# Patient Record
Sex: Female | Born: 1950 | Race: White | Hispanic: No | Smoking: Never smoker
Health system: Southern US, Community
[De-identification: ages and names within clinical notes are randomized; demographics above are authoritative.]

## PROBLEM LIST (undated history)

## (undated) DIAGNOSIS — E785 Hyperlipidemia, unspecified: Secondary | ICD-10-CM

## (undated) DIAGNOSIS — E079 Disorder of thyroid, unspecified: Secondary | ICD-10-CM

## (undated) DIAGNOSIS — I10 Essential (primary) hypertension: Secondary | ICD-10-CM

## (undated) DIAGNOSIS — N1831 Chronic kidney disease, stage 3a: Secondary | ICD-10-CM

## (undated) DIAGNOSIS — E039 Hypothyroidism, unspecified: Secondary | ICD-10-CM

## (undated) HISTORY — PX: CHOLECYSTECTOMY: SHX55

---

## 2021-12-10 ENCOUNTER — Emergency Department (HOSPITAL_BASED_OUTPATIENT_CLINIC_OR_DEPARTMENT_OTHER): Payer: Medicare Other

## 2021-12-10 ENCOUNTER — Encounter (HOSPITAL_BASED_OUTPATIENT_CLINIC_OR_DEPARTMENT_OTHER): Payer: Self-pay

## 2021-12-10 ENCOUNTER — Other Ambulatory Visit: Payer: Self-pay

## 2021-12-10 ENCOUNTER — Inpatient Hospital Stay (HOSPITAL_BASED_OUTPATIENT_CLINIC_OR_DEPARTMENT_OTHER)
Admission: EM | Admit: 2021-12-10 | Discharge: 2021-12-14 | DRG: 641 | Disposition: A | Discharge status: Home Health | Payer: Medicare Other | Attending: Internal Medicine | Admitting: Internal Medicine

## 2021-12-10 DIAGNOSIS — Z9049 Acquired absence of other specified parts of digestive tract: Secondary | ICD-10-CM

## 2021-12-10 DIAGNOSIS — Z79899 Other long term (current) drug therapy: Secondary | ICD-10-CM

## 2021-12-10 DIAGNOSIS — Z6834 Body mass index (BMI) 34.0-34.9, adult: Secondary | ICD-10-CM

## 2021-12-10 DIAGNOSIS — R21 Rash and other nonspecific skin eruption: Secondary | ICD-10-CM | POA: Diagnosis present

## 2021-12-10 DIAGNOSIS — K5900 Constipation, unspecified: Secondary | ICD-10-CM | POA: Diagnosis present

## 2021-12-10 DIAGNOSIS — M47816 Spondylosis without myelopathy or radiculopathy, lumbar region: Secondary | ICD-10-CM | POA: Diagnosis present

## 2021-12-10 DIAGNOSIS — E785 Hyperlipidemia, unspecified: Secondary | ICD-10-CM | POA: Diagnosis present

## 2021-12-10 DIAGNOSIS — I129 Hypertensive chronic kidney disease with stage 1 through stage 4 chronic kidney disease, or unspecified chronic kidney disease: Secondary | ICD-10-CM | POA: Diagnosis present

## 2021-12-10 DIAGNOSIS — E039 Hypothyroidism, unspecified: Secondary | ICD-10-CM | POA: Diagnosis present

## 2021-12-10 DIAGNOSIS — E669 Obesity, unspecified: Secondary | ICD-10-CM | POA: Diagnosis present

## 2021-12-10 DIAGNOSIS — N179 Acute kidney failure, unspecified: Secondary | ICD-10-CM | POA: Diagnosis present

## 2021-12-10 DIAGNOSIS — Z888 Allergy status to other drugs, medicaments and biological substances status: Secondary | ICD-10-CM

## 2021-12-10 DIAGNOSIS — I3139 Other pericardial effusion (noninflammatory): Secondary | ICD-10-CM | POA: Diagnosis present

## 2021-12-10 DIAGNOSIS — Z79890 Hormone replacement therapy (postmenopausal): Secondary | ICD-10-CM

## 2021-12-10 DIAGNOSIS — I10 Essential (primary) hypertension: Secondary | ICD-10-CM | POA: Diagnosis present

## 2021-12-10 DIAGNOSIS — N1831 Chronic kidney disease, stage 3a: Secondary | ICD-10-CM | POA: Diagnosis present

## 2021-12-10 DIAGNOSIS — K5790 Diverticulosis of intestine, part unspecified, without perforation or abscess without bleeding: Secondary | ICD-10-CM | POA: Diagnosis present

## 2021-12-10 DIAGNOSIS — E860 Dehydration: Secondary | ICD-10-CM | POA: Diagnosis present

## 2021-12-10 DIAGNOSIS — G8929 Other chronic pain: Secondary | ICD-10-CM | POA: Diagnosis present

## 2021-12-10 DIAGNOSIS — K760 Fatty (change of) liver, not elsewhere classified: Secondary | ICD-10-CM | POA: Diagnosis present

## 2021-12-10 HISTORY — DX: Hypothyroidism, unspecified: E03.9

## 2021-12-10 HISTORY — DX: Hyperlipidemia, unspecified: E78.5

## 2021-12-10 HISTORY — DX: Disorder of thyroid, unspecified: E07.9

## 2021-12-10 HISTORY — DX: Chronic kidney disease, stage 3a: N18.31

## 2021-12-10 HISTORY — DX: Essential (primary) hypertension: I10

## 2021-12-10 LAB — COMPREHENSIVE METABOLIC PANEL
ALT: 22 U/L (ref 0–44)
AST: 24 U/L (ref 15–41)
Albumin: 4.6 g/dL (ref 3.5–5.0)
Alkaline Phosphatase: 97 U/L (ref 38–126)
Anion gap: 10 (ref 5–15)
BUN: 45 mg/dL — ABNORMAL HIGH (ref 8–23)
CO2: 26 mmol/L (ref 22–32)
Calcium: 13.1 mg/dL (ref 8.9–10.3)
Chloride: 102 mmol/L (ref 98–111)
Creatinine, Ser: 3.98 mg/dL — ABNORMAL HIGH (ref 0.44–1.00)
GFR, Estimated: 12 mL/min — ABNORMAL LOW (ref >60)
Glucose, Bld: 111 mg/dL — ABNORMAL HIGH (ref 70–99)
Potassium: 3.6 mmol/L (ref 3.5–5.1)
Sodium: 138 mmol/L (ref 135–145)
Total Bilirubin: 0.9 mg/dL (ref 0.3–1.2)
Total Protein: 8.2 g/dL — ABNORMAL HIGH (ref 6.5–8.1)

## 2021-12-10 LAB — CBC WITH DIFFERENTIAL/PLATELET
Abs Immature Granulocytes: 0.05 10*3/uL (ref 0.00–0.07)
Basophils Absolute: 0 10*3/uL (ref 0.0–0.1)
Basophils Relative: 0 %
Eosinophils Absolute: 0 10*3/uL (ref 0.0–0.5)
Eosinophils Relative: 0 %
HCT: 39.5 % (ref 36.0–46.0)
Hemoglobin: 13.8 g/dL (ref 12.0–15.0)
Immature Granulocytes: 0 %
Lymphocytes Relative: 17 %
Lymphs Abs: 2.1 10*3/uL (ref 0.7–4.0)
MCH: 30.1 pg (ref 26.0–34.0)
MCHC: 34.9 g/dL (ref 30.0–36.0)
MCV: 86.1 fL (ref 80.0–100.0)
Monocytes Absolute: 1 10*3/uL (ref 0.1–1.0)
Monocytes Relative: 8 %
Neutro Abs: 9.1 10*3/uL — ABNORMAL HIGH (ref 1.7–7.7)
Neutrophils Relative %: 75 %
Platelets: 211 10*3/uL (ref 150–400)
RBC: 4.59 MIL/uL (ref 3.87–5.11)
RDW: 13.2 % (ref 11.5–15.5)
WBC: 12.3 10*3/uL — ABNORMAL HIGH (ref 4.0–10.5)
nRBC: 0 % (ref 0.0–0.2)

## 2021-12-10 LAB — LIPASE, BLOOD: Lipase: 33 U/L (ref 11–51)

## 2021-12-10 LAB — URINALYSIS, MICROSCOPIC (REFLEX): WBC, UA: NONE SEEN WBC/hpf (ref 0–5)

## 2021-12-10 LAB — URINALYSIS, ROUTINE W REFLEX MICROSCOPIC
Bilirubin Urine: NEGATIVE
Glucose, UA: NEGATIVE mg/dL
Ketones, ur: NEGATIVE mg/dL
Leukocytes,Ua: NEGATIVE
Nitrite: NEGATIVE
Protein, ur: 30 mg/dL — AB
Specific Gravity, Urine: 1.015 (ref 1.005–1.030)
pH: 8.5 — ABNORMAL HIGH (ref 5.0–8.0)

## 2021-12-10 LAB — MAGNESIUM: Magnesium: 3.4 mg/dL — ABNORMAL HIGH (ref 1.7–2.4)

## 2021-12-10 LAB — LACTIC ACID, PLASMA
Lactic Acid, Venous: 1.3 mmol/L (ref 0.5–1.9)
Lactic Acid, Venous: 1.5 mmol/L (ref 0.5–1.9)

## 2021-12-10 LAB — TROPONIN I (HIGH SENSITIVITY): Troponin I (High Sensitivity): 17 ng/L (ref <18)

## 2021-12-10 LAB — PHOSPHORUS: Phosphorus: 3.2 mg/dL (ref 2.5–4.6)

## 2021-12-10 MED ORDER — CALCITONIN (SALMON) 200 UNIT/ML IJ SOLN
4.0000 [IU]/kg | Freq: Two times a day (BID) | INTRAMUSCULAR | Status: AC
  Administered 2021-12-11 – 2021-12-12 (×4): 308 [IU] via INTRAMUSCULAR
  Filled 2021-12-10 (×6): qty 1.54

## 2021-12-10 MED ORDER — SODIUM CHLORIDE 0.9 % IV BOLUS
1000.0000 mL | Freq: Once | INTRAVENOUS | Status: AC
  Administered 2021-12-10: 1000 mL via INTRAVENOUS

## 2021-12-10 NOTE — ED Notes (Signed)
Patient transported to CT 

## 2021-12-10 NOTE — Progress Notes (Signed)
  TRH will assume care on arrival to accepting facility. Until arrival, care as per EDP. However, TRH available 24/7 for questions and assistance.   Nursing staff please page TRH Admits and Consults ([phone]) as soon as the patient arrives to the hospital.  Kemarion Abbey, DO [facility]  

## 2021-12-10 NOTE — ED Triage Notes (Signed)
Pt states she is having difficulty walking today r/t left sided back pain Excessive burping Hypertensive

## 2021-12-11 ENCOUNTER — Inpatient Hospital Stay (HOSPITAL_COMMUNITY): Payer: Medicare Other

## 2021-12-11 ENCOUNTER — Encounter (HOSPITAL_COMMUNITY): Payer: Self-pay | Admitting: Internal Medicine

## 2021-12-11 DIAGNOSIS — N179 Acute kidney failure, unspecified: Secondary | ICD-10-CM | POA: Diagnosis present

## 2021-12-11 DIAGNOSIS — E039 Hypothyroidism, unspecified: Secondary | ICD-10-CM

## 2021-12-11 DIAGNOSIS — M47816 Spondylosis without myelopathy or radiculopathy, lumbar region: Secondary | ICD-10-CM | POA: Diagnosis present

## 2021-12-11 DIAGNOSIS — Z6834 Body mass index (BMI) 34.0-34.9, adult: Secondary | ICD-10-CM | POA: Diagnosis not present

## 2021-12-11 DIAGNOSIS — I3139 Other pericardial effusion (noninflammatory): Secondary | ICD-10-CM | POA: Diagnosis present

## 2021-12-11 DIAGNOSIS — I10 Essential (primary) hypertension: Secondary | ICD-10-CM | POA: Diagnosis not present

## 2021-12-11 DIAGNOSIS — K760 Fatty (change of) liver, not elsewhere classified: Secondary | ICD-10-CM | POA: Diagnosis present

## 2021-12-11 DIAGNOSIS — K5790 Diverticulosis of intestine, part unspecified, without perforation or abscess without bleeding: Secondary | ICD-10-CM | POA: Diagnosis present

## 2021-12-11 DIAGNOSIS — Z9049 Acquired absence of other specified parts of digestive tract: Secondary | ICD-10-CM | POA: Diagnosis not present

## 2021-12-11 DIAGNOSIS — Z888 Allergy status to other drugs, medicaments and biological substances status: Secondary | ICD-10-CM | POA: Diagnosis not present

## 2021-12-11 DIAGNOSIS — E785 Hyperlipidemia, unspecified: Secondary | ICD-10-CM | POA: Diagnosis present

## 2021-12-11 DIAGNOSIS — N1831 Chronic kidney disease, stage 3a: Secondary | ICD-10-CM | POA: Diagnosis present

## 2021-12-11 DIAGNOSIS — I129 Hypertensive chronic kidney disease with stage 1 through stage 4 chronic kidney disease, or unspecified chronic kidney disease: Secondary | ICD-10-CM | POA: Diagnosis present

## 2021-12-11 DIAGNOSIS — R21 Rash and other nonspecific skin eruption: Secondary | ICD-10-CM | POA: Diagnosis present

## 2021-12-11 DIAGNOSIS — Z79890 Hormone replacement therapy (postmenopausal): Secondary | ICD-10-CM | POA: Diagnosis not present

## 2021-12-11 DIAGNOSIS — E860 Dehydration: Secondary | ICD-10-CM | POA: Diagnosis present

## 2021-12-11 DIAGNOSIS — G8929 Other chronic pain: Secondary | ICD-10-CM | POA: Diagnosis present

## 2021-12-11 DIAGNOSIS — K5900 Constipation, unspecified: Secondary | ICD-10-CM | POA: Diagnosis present

## 2021-12-11 DIAGNOSIS — Z79899 Other long term (current) drug therapy: Secondary | ICD-10-CM | POA: Diagnosis not present

## 2021-12-11 DIAGNOSIS — E669 Obesity, unspecified: Secondary | ICD-10-CM | POA: Diagnosis present

## 2021-12-11 LAB — CK: Total CK: 101 U/L (ref 38–234)

## 2021-12-11 LAB — TSH: TSH: 0.807 u[IU]/mL (ref 0.350–4.500)

## 2021-12-11 LAB — CBC
HCT: 33.1 % — ABNORMAL LOW (ref 36.0–46.0)
Hemoglobin: 11.2 g/dL — ABNORMAL LOW (ref 12.0–15.0)
MCH: 29.5 pg (ref 26.0–34.0)
MCHC: 33.8 g/dL (ref 30.0–36.0)
MCV: 87.1 fL (ref 80.0–100.0)
Platelets: 176 10*3/uL (ref 150–400)
RBC: 3.8 MIL/uL — ABNORMAL LOW (ref 3.87–5.11)
RDW: 13.2 % (ref 11.5–15.5)
WBC: 10.4 10*3/uL (ref 4.0–10.5)
nRBC: 0 % (ref 0.0–0.2)

## 2021-12-11 LAB — BASIC METABOLIC PANEL
Anion gap: 11 (ref 5–15)
BUN: 38 mg/dL — ABNORMAL HIGH (ref 8–23)
CO2: 21 mmol/L — ABNORMAL LOW (ref 22–32)
Calcium: 11.4 mg/dL — ABNORMAL HIGH (ref 8.9–10.3)
Chloride: 109 mmol/L (ref 98–111)
Creatinine, Ser: 4.02 mg/dL — ABNORMAL HIGH (ref 0.44–1.00)
GFR, Estimated: 11 mL/min — ABNORMAL LOW (ref >60)
Glucose, Bld: 122 mg/dL — ABNORMAL HIGH (ref 70–99)
Potassium: 3.2 mmol/L — ABNORMAL LOW (ref 3.5–5.1)
Sodium: 141 mmol/L (ref 135–145)

## 2021-12-11 LAB — HIV ANTIBODY (ROUTINE TESTING W REFLEX): HIV Screen 4th Generation wRfx: NONREACTIVE

## 2021-12-11 LAB — TROPONIN I (HIGH SENSITIVITY): Troponin I (High Sensitivity): 20 ng/L — ABNORMAL HIGH (ref <18)

## 2021-12-11 LAB — VITAMIN D 25 HYDROXY (VIT D DEFICIENCY, FRACTURES): Vit D, 25-Hydroxy: 237.94 ng/mL — ABNORMAL HIGH (ref 30–100)

## 2021-12-11 MED ORDER — DEXAMETHASONE SODIUM PHOSPHATE 10 MG/ML IJ SOLN
8.0000 mg | Freq: Once | INTRAMUSCULAR | Status: AC
  Administered 2021-12-11: 8 mg via INTRAVENOUS
  Filled 2021-12-11: qty 0.8

## 2021-12-11 MED ORDER — TRAMADOL HCL 50 MG PO TABS: 50.0000 mg | ORAL_TABLET | Freq: Two times a day (BID) | ORAL | Status: DC | PRN

## 2021-12-11 MED ORDER — ENOXAPARIN SODIUM 30 MG/0.3ML IJ SOSY
30.0000 mg | PREFILLED_SYRINGE | INTRAMUSCULAR | Status: DC
Start: 2021-12-11 — End: 2021-12-14
  Administered 2021-12-11 – 2021-12-13 (×3): 30 mg via SUBCUTANEOUS
  Filled 2021-12-11 (×3): qty 0.3

## 2021-12-11 MED ORDER — SODIUM CHLORIDE 0.9 % IV SOLN: INTRAVENOUS | Status: DC

## 2021-12-11 MED ORDER — ACETAMINOPHEN 325 MG PO TABS
650.0000 mg | ORAL_TABLET | Freq: Four times a day (QID) | ORAL | Status: DC | PRN
  Filled 2021-12-11: qty 2

## 2021-12-11 MED ORDER — PANTOPRAZOLE SODIUM 40 MG PO TBEC
40.0000 mg | DELAYED_RELEASE_TABLET | Freq: Every day | ORAL | Status: DC
  Administered 2021-12-11 – 2021-12-14 (×4): 40 mg via ORAL
  Filled 2021-12-11 (×4): qty 1

## 2021-12-11 MED ORDER — CARVEDILOL 3.125 MG PO TABS: 3.1250 mg | ORAL_TABLET | Freq: Two times a day (BID) | ORAL | Status: DC

## 2021-12-11 MED ORDER — DEXAMETHASONE SODIUM PHOSPHATE 4 MG/ML IJ SOLN
4.0000 mg | INTRAMUSCULAR | Status: DC
  Administered 2021-12-12 – 2021-12-13 (×2): 4 mg via INTRAVENOUS
  Filled 2021-12-11 (×2): qty 1

## 2021-12-11 MED ORDER — CARVEDILOL 3.125 MG PO TABS
3.1250 mg | ORAL_TABLET | Freq: Two times a day (BID) | ORAL | Status: DC
  Administered 2021-12-11 – 2021-12-12 (×4): 3.125 mg via ORAL
  Filled 2021-12-11 (×4): qty 1

## 2021-12-11 MED ORDER — HYDRALAZINE HCL 20 MG/ML IJ SOLN
10.0000 mg | Freq: Four times a day (QID) | INTRAMUSCULAR | Status: DC | PRN
  Administered 2021-12-11: 10 mg via INTRAVENOUS
  Filled 2021-12-11: qty 1

## 2021-12-11 MED ORDER — ONDANSETRON HCL 4 MG/2ML IJ SOLN: 4.0000 mg | Freq: Four times a day (QID) | INTRAMUSCULAR | Status: DC | PRN

## 2021-12-11 MED ORDER — ONDANSETRON HCL 4 MG PO TABS: 4.0000 mg | ORAL_TABLET | Freq: Four times a day (QID) | ORAL | Status: DC | PRN

## 2021-12-11 MED ORDER — POTASSIUM CHLORIDE CRYS ER 20 MEQ PO TBCR
40.0000 meq | EXTENDED_RELEASE_TABLET | Freq: Once | ORAL | Status: AC
  Administered 2021-12-11: 40 meq via ORAL
  Filled 2021-12-11: qty 2

## 2021-12-11 MED ORDER — ACETAMINOPHEN 650 MG RE SUPP: 650.0000 mg | Freq: Four times a day (QID) | RECTAL | Status: DC | PRN

## 2021-12-11 MED ORDER — TRAMADOL HCL 50 MG PO TABS: 50.0000 mg | ORAL_TABLET | Freq: Four times a day (QID) | ORAL | Status: DC | PRN

## 2021-12-11 NOTE — TOC Initial Note (Signed)
Transition of Care [facility]) - Initial/Assessment Note    Patient Details  Name: Lori Graham MRN: [id number] Date of Birth: [birth year]  Transition of Care [facility]) CM/SW Contact:    Lori Graham, Lori Coria, RN Phone Number: 12/11/2021, 5:38 PM  Clinical Narrative:                  CM spoke with patient and son, Lori Graham at bedside about needs for post hospital transition. Admitted for Hypercalcemia.  From home alone. Has two children. Independent with care and drive self prior to admission. Patient is a Retired Engineer, civil (consulting) from [employer]. Does not have DME's at home.  PCP is Lori Manges, FNP and uses [employer] on [address] [address].  Home health referral sent to [facility] as patient states she has no preference. Cory voiced acceptance and info on AVS. Family to transport at discharge.  CM will continue to follow with needs.   Expected Discharge Plan: Home w Home Health Services Barriers to Discharge: Continued Medical Work up   Patient Goals and CMS Choice Patient states their goals for this hospitalization and ongoing recovery are:: To return home CMS Medicare.gov Compare Post Acute Care list provided to:: Patient Choice offered to / list presented to : Patient, Adult Children (Son, Lori Graham)  Expected Discharge Plan and Services Expected Discharge Plan: Home w Home Health Services   Discharge Planning Services: CM Consult Post Acute Care Choice: Home Health Living arrangements for the past 2 months: Single Family Home                 DME Arranged: 3-N-1 DME Agency: [employer] Date DME Agency Contacted: 12/11/21 Time DME Agency Contacted: [phone] Representative spoke with at DME Agency: Arnold Long HH Arranged: PT, OT HH Agency: [facility] Date [facility] Agency Contacted: 12/11/21 Time HH Agency Contacted: 1100 Representative spoke with at [facility] Agency: Kandee Keen  Prior Living Arrangements/Services Living arrangements for the past 2 months: Single Family  Home Lives with:: Self Patient language and need for interpreter reviewed:: Yes Do you feel safe going back to the place where you live?: Yes      Need for Family Participation in Patient Care: Yes (Comment) Care giver support system in place?: Yes (comment)   Criminal Activity/Legal Involvement Pertinent to Current Situation/Hospitalization: No - Comment as needed  Activities of Daily Living Home Assistive Devices/Equipment: None ADL Screening (condition at time of admission) Patient's cognitive ability adequate to safely complete daily activities?: Yes Is the patient deaf or have difficulty hearing?: No Does the patient have difficulty seeing, even when wearing glasses/contacts?: No Does the patient have difficulty concentrating, remembering, or making decisions?: Yes Patient able to express need for assistance with ADLs?: Yes (Per pt occasionally occurs) Does the patient have difficulty dressing or bathing?: No Independently performs ADLs?: Yes (appropriate for developmental age) Does the patient have difficulty walking or climbing stairs?: Yes Weakness of Legs: Left Weakness of Arms/Hands: None  Permission Sought/Granted Permission sought to share information with : Case Manager, Magazine features editor, Family Supports Permission granted to share information with : Yes, Verbal Permission Granted              Emotional Assessment Appearance:: Appears stated age Attitude/Demeanor/Rapport: Engaged, Gracious Affect (typically observed): Accepting, Calm, Appropriate, Hopeful Orientation: : Oriented to Self, Oriented to Place, Oriented to Situation Alcohol / Substance Use: Not Applicable Psych Involvement: No (comment)  Admission diagnosis:  Hypercalcemia [E83.52] AKI (acute kidney injury) (HCC) [N17.9] Patient Active Problem List  Diagnosis Date Noted   AKI (acute kidney injury) (HCC) 12/11/2021   HTN (hypertension) 12/11/2021   Hypothyroidism 12/11/2021    Hypercalcemia 12/10/2021   PCP:  Lori Manges, FNP Pharmacy:   [facility] [id number] - [place], [place] - [address] [place] [address] Phone: [phone] Fax: [phone]     Social Determinants of Health (SDOH) Interventions    Readmission Risk Interventions     No data to display

## 2021-12-11 NOTE — Consult Note (Addendum)
[facility]  HISTORY AND PHYSICAL  Lori Graham is an 71 y.o. female.    Chief Complaint:  back pain  HPI: Pt is a 41F with a PMH sig for HTN, HLD, hypothyroidism, and CKD 3a who is now seen in consultation at the request of Dr. Thedore Mins for evaluation for hypercalcemia.    Pt presented to the ED yesterday for evaluation of back pain.  Had some subjective f/c along with dark urine yesterday.  Notes that it's more of a sciatic pain that worsens with change in position.  In ED, Cr was  3.98, Ca 13.1, K 3.6, CO2 26, WBC ct 12.6, Hgb 13.8, Hct 39.5, plts 211.  CTA/P without contrast showed hepatic steatosis, small pericardial effusion, diverticulosis.  Is being hydrated with isotonic fluids and Ca is 11.4 with Cr 4.02 today.  Renal US negative, MRI lumbar spine performed but read pending.    In this setting we are asked to see.    PT has been taking vitamin D pills, unknown strength, 3 TID.  Also taking 2 separate MVIs with Vit D in them.  Also takes sig amount of herbal supps.  No thiazide diuretic to her knowledge.  No h/o kidney stones.    Last known Cr March 2023 was 1.27, Ca was 10.3 at that time.  She is not UTD on any cancer screening per her report including mammogram, c-scope, Pap.  PMH: Past Medical History:  Diagnosis Date   HLD (hyperlipidemia)    Hypertension    Hypothyroidism    Stage 3a chronic kidney disease (CKD) (HCC)    Creat 1.27 in March 2023   PSH: Past Surgical History:  Procedure Laterality Date   CHOLECYSTECTOMY      Past Medical History:  Diagnosis Date   HLD (hyperlipidemia)    Hypertension    Hypothyroidism    Stage 3a chronic kidney disease (CKD) (HCC)    Creat 1.27 in March 2023    Medications:  Scheduled:  calcitonin  4 Units/kg Intramuscular Q12H   carvedilol  3.125 mg Oral BID WC   enoxaparin (LOVENOX) injection  30 mg Subcutaneous Q24H   pantoprazole  40 mg Oral Daily    Medications Prior to Admission  Medication Sig  Dispense Refill   ARMOUR THYROID 30 MG tablet Take by mouth.     losartan (COZAAR) 50 MG tablet Take 50 mg by mouth 2 (two) times daily.      ALLERGIES:   Allergies  Allergen Reactions   Benzoin Rash    Other reaction(s): Wheezing (ALLERGY/intolerance)    FAM HX: No family history on file.  Social History:   reports that she has never smoked. She has never used smokeless tobacco. She reports that she does not drink alcohol and does not use drugs.  ROS: ROS: all other systems reviewed and are negative except as per HPI  Blood pressure (!) 153/92, pulse 90, temperature 98.3 F (36.8 C), resp. rate 16, height 5\' 2"  (1.575 m), weight 85 kg, SpO2 98 %. PHYSICAL EXAM: Physical Exam GEN NAD, sitting in bed HEENT EOMI PERRL NECK + faint rash underneath chin, no JVD PULM clear bilaterally CV RRR no m/r/g ABD soft, nondistended EXT no LE edema NEURO AAO x 3 nonfocal SKIN faint rash underneath chinas above  Results for orders placed or performed during the hospital encounter of 12/10/21 (from the past 48 hour(s))  Comprehensive metabolic panel     Status: Abnormal   Collection Time: 12/10/21  8:05 PM  Result Value Ref Range   Sodium 138 135 - 145 mmol/L   Potassium 3.6 3.5 - 5.1 mmol/L   Chloride 102 98 - 111 mmol/L   CO2 26 22 - 32 mmol/L   Glucose, Bld 111 (H) 70 - 99 mg/dL    Comment: Glucose reference range applies only to samples taken after fasting for at least 8 hours.   BUN 45 (H) 8 - 23 mg/dL   Creatinine, Ser 5.95 (H) 0.44 - 1.00 mg/dL   Calcium 63.8 (HH) 8.9 - 10.3 mg/dL    Comment: CRITICAL RESULT CALLED TO, READ BACK BY AND VERIFIED WITH: DILLARD ALFRED RN AT 2047 ON 12/10/21 BY [email or web address]    Total Protein 8.2 (H) 6.5 - 8.1 g/dL   Albumin 4.6 3.5 - 5.0 g/dL   AST 24 15 - 41 U/L   ALT 22 0 - 44 U/L   Alkaline Phosphatase 97 38 - 126 U/L   Total Bilirubin 0.9 0.3 - 1.2 mg/dL   GFR, Estimated 12 (L) >60 mL/min    Comment: (NOTE) Calculated using the CKD-EPI  Creatinine Equation (2021)    Anion gap 10 5 - 15    Comment: Performed at [facility], [address] [place], [place] [address]  CBC with Differential     Status: Abnormal   Collection Time: 12/10/21  8:05 PM  Result Value Ref Range   WBC 12.3 (H) 4.0 - 10.5 K/uL   RBC 4.59 3.87 - 5.11 MIL/uL   Hemoglobin 13.8 12.0 - 15.0 g/dL   HCT 32.9 51.8 - 84.1 %   MCV 86.1 80.0 - 100.0 fL   MCH 30.1 26.0 - 34.0 pg   MCHC 34.9 30.0 - 36.0 g/dL   RDW 66.0 63.0 - 16.0 %   Platelets 211 150 - 400 K/uL   nRBC 0.0 0.0 - 0.2 %   Neutrophils Relative % 75 %   Neutro Abs 9.1 (H) 1.7 - 7.7 K/uL   Lymphocytes Relative 17 %   Lymphs Abs 2.1 0.7 - 4.0 K/uL   Monocytes Relative 8 %   Monocytes Absolute 1.0 0.1 - 1.0 K/uL   Eosinophils Relative 0 %   Eosinophils Absolute 0.0 0.0 - 0.5 K/uL   Basophils Relative 0 %   Basophils Absolute 0.0 0.0 - 0.1 K/uL   Immature Granulocytes 0 %   Abs Immature Granulocytes 0.05 0.00 - 0.07 K/uL    Comment: Performed at [facility], [address] [place], [place] [address]  Troponin I (High Sensitivity)     Status: None   Collection Time: 12/10/21  8:05 PM  Result Value Ref Range   Troponin I (High Sensitivity) 17 <18 ng/L    Comment: (NOTE) Elevated high sensitivity troponin I (hsTnI) values and significant  changes across serial measurements may suggest ACS but many other  chronic and acute conditions are known to elevate hsTnI results.  Refer to the "Links" section for chest pain algorithms and additional  guidance. Performed at [facility], [address] [place], [place] [address]   Lipase, blood     Status: None   Collection Time: 12/10/21  8:52 PM  Result Value Ref Range   Lipase 33 11 - 51 U/L    Comment: Performed at [facility], [address] [place], [place] [address]  Magnesium     Status: Abnormal   Collection Time: 12/10/21  8:52 PM  Result Value Ref Range  Magnesium 3.4 (H) 1.7 - 2.4  mg/dL    Comment: Performed at [facility], [address] [place], [place] [address]  Phosphorus     Status: None   Collection Time: 12/10/21  8:52 PM  Result Value Ref Range   Phosphorus 3.2 2.5 - 4.6 mg/dL    Comment: Performed at [facility], [address] [place], [place] [address]  Lactic acid, plasma     Status: None   Collection Time: 12/10/21  9:08 PM  Result Value Ref Range   Lactic Acid, Venous 1.3 0.5 - 1.9 mmol/L    Comment: Performed at [facility], [address] [place], [place] [address]  Urinalysis, Routine w reflex microscopic Urine, Clean Catch     Status: Abnormal   Collection Time: 12/10/21 10:05 PM  Result Value Ref Range   Color, Urine YELLOW YELLOW   APPearance CLEAR CLEAR   Specific Gravity, Urine 1.015 1.005 - 1.030   pH 8.5 (H) 5.0 - 8.0   Glucose, UA NEGATIVE NEGATIVE mg/dL   Hgb urine dipstick TRACE (A) NEGATIVE   Bilirubin Urine NEGATIVE NEGATIVE   Ketones, ur NEGATIVE NEGATIVE mg/dL   Protein, ur 30 (A) NEGATIVE mg/dL   Nitrite NEGATIVE NEGATIVE   Leukocytes,Ua NEGATIVE NEGATIVE    Comment: Performed at [facility], [address] [place], [place] [address]  Urinalysis, Microscopic (reflex)     Status: Abnormal   Collection Time: 12/10/21 10:05 PM  Result Value Ref Range   RBC / HPF 0-5 0 - 5 RBC/hpf   WBC, UA NONE SEEN 0 - 5 WBC/hpf   Bacteria, UA FEW (A) NONE SEEN   Squamous Epithelial / LPF 0-5 0 - 5    Comment: Performed at [facility], [address] [place], [place] [address]  Lactic acid, plasma     Status: None   Collection Time: 12/10/21 10:56 PM  Result Value Ref Range   Lactic Acid, Venous 1.5 0.5 - 1.9 mmol/L    Comment: Performed at [facility], [address] [place], [place] [address]  Troponin I (High Sensitivity)     Status: Abnormal   Collection Time: 12/10/21 10:56 PM  Result Value Ref Range   Troponin I (High Sensitivity) 20 (H) <18 ng/L     Comment: Performed at [facility], [address] [place], [place] [address]  HIV Antibody (routine testing w rflx)     Status: None   Collection Time: 12/11/21  6:28 AM  Result Value Ref Range   HIV Screen 4th Generation wRfx Non Reactive Non Reactive    Comment: Performed at [facility], [address], [place], [place] [address]  VITAMIN D 25 Hydroxy (Vit-D Deficiency, Fractures)     Status: Abnormal   Collection Time: 12/11/21  6:28 AM  Result Value Ref Range   Vit D, 25-Hydroxy 237.94 (H) 30 - 100 ng/mL    Comment: RESULT CONFIRMED BY AUTOMATED DILUTION (NOTE) Vitamin D deficiency has been defined by the Institute of Medicine  and an Endocrine Society practice guideline as a level of serum 25-OH  vitamin D less than 20 ng/mL (1,2). The Endocrine Society went on to  further define vitamin D insufficiency as a level between 21 and 29  ng/mL (2).  1. IOM (Institute of Medicine). 2010. Dietary reference intakes for  calcium and D. [place]: [employer].  2. Holick MF, Binkley Placer, Bischoff-Ferrari HA, et al. Evaluation,  treatment, and prevention of vitamin D deficiency: an Endocrine  Society clinical practice guideline, JCEM. 2011 Jul; 96(7): 1911-30.  Performed at [facility], [address] [place], [place] [address]   CBC     Status: Abnormal   Collection Time: 12/11/21  6:28 AM  Result Value Ref Range   WBC 10.4 4.0 - 10.5 K/uL   RBC 3.80 (L) 3.87 - 5.11 MIL/uL   Hemoglobin 11.2 (L) 12.0 - 15.0 g/dL   HCT 60.4 (L) 54.0 - 98.1 %   MCV 87.1 80.0 - 100.0 fL   MCH 29.5 26.0 - 34.0 pg   MCHC 33.8 30.0 - 36.0 g/dL   RDW 19.1 47.8 - 29.5 %   Platelets 176 150 - 400 K/uL   nRBC 0.0 0.0 - 0.2 %    Comment: Performed at [facility], [address], [place], [place] [address]  Basic metabolic panel     Status: Abnormal   Collection Time: 12/11/21  6:28 AM  Result Value Ref Range   Sodium 141 135 - 145 mmol/L   Potassium  3.2 (L) 3.5 - 5.1 mmol/L   Chloride 109 98 - 111 mmol/L   CO2 21 (L) 22 - 32 mmol/L   Glucose, Bld 122 (H) 70 - 99 mg/dL    Comment: Glucose reference range applies only to samples taken after fasting for at least 8 hours.   BUN 38 (H) 8 - 23 mg/dL   Creatinine, Ser 8.65 (H) 0.44 - 1.00 mg/dL   Calcium 78.4 (H) 8.9 - 10.3 mg/dL   GFR, Estimated 11 (L) >60 mL/min    Comment: (NOTE) Calculated using the CKD-EPI Creatinine Equation (2021)    Anion gap 11 5 - 15    Comment: Performed at [facility], [address] [place], [place] [address]  TSH     Status: None   Collection Time: 12/11/21  6:28 AM  Result Value Ref Range   TSH 0.807 0.350 - 4.500 uIU/mL    Comment: Performed by a 3rd Generation assay with a functional sensitivity of <=0.01 uIU/mL. Performed at [facility], [address] [place], [place] [address]   CK     Status: None   Collection Time: 12/11/21  6:28 AM  Result Value Ref Range   Total CK 101 38 - 234 U/L    Comment: Performed at [facility], [address], [place], [place] [address]    US RENAL  Result Date: 12/11/2021 CLINICAL DATA:  Acute kidney injury EXAM: RENAL / URINARY TRACT ULTRASOUND COMPLETE COMPARISON:  CT 12/10/2021 FINDINGS: Right Kidney: Renal measurements: 10.4 x 4.3 x 4.9 cm = volume: 114 mL. Echogenicity within normal limits. No mass or hydronephrosis visualized. Left Kidney: Renal measurements: 11.3 x 4.8 x 4.9 cm = volume: 140 mL. Echogenicity within normal limits. No mass or hydronephrosis visualized. Bladder: Appears normal for degree of bladder distention. Other: Hepatic steatosis. IMPRESSION: No evidence of obstructive uropathy. Electronically Signed   By: Duanne Guess D.O.   On: 12/11/2021 11:43   CT CHEST ABDOMEN PELVIS WO CONTRAST  Result Date: 12/10/2021 CLINICAL DATA:  Left lower quadrant pain EXAM: CT CHEST, ABDOMEN AND PELVIS WITHOUT CONTRAST TECHNIQUE: Multidetector CT imaging of the chest, abdomen and pelvis  was performed following the standard protocol without IV contrast. RADIATION DOSE REDUCTION: This exam was performed according to the departmental dose-optimization program which includes  automated exposure control, adjustment of the mA and/or kV according to patient size and/or use of iterative reconstruction technique. COMPARISON:  None Available. FINDINGS: CT CHEST FINDINGS Cardiovascular: Limited evaluation without intravenous contrast. Mild atherosclerosis. No aneurysm. Mild coronary vascular calcification. Normal cardiac size. Small pericardial effusion. Mediastinum/Nodes: Midline trachea. No thyroid mass. No suspicious lymph nodes. Esophagus within normal limits Lungs/Pleura: Lungs are clear. No pleural effusion or pneumothorax. Musculoskeletal: No chest wall mass or suspicious bone lesions identified. CT ABDOMEN PELVIS FINDINGS Hepatobiliary: Hepatic steatosis. Status post cholecystectomy. No biliary dilatation. Pancreas: Unremarkable. No pancreatic ductal dilatation or surrounding inflammatory changes. Spleen: Normal in size without focal abnormality. Adrenals/Urinary Tract: Adrenal glands are unremarkable. Kidneys are normal, without renal calculi, focal lesion, or hydronephrosis. Bladder is unremarkable. Stomach/Bowel: Stomach is within normal limits. Appendix not well seen. Diverticular disease of the left colon without definitive inflammation. No evidence of bowel wall thickening, distention, or inflammatory changes. Vascular/Lymphatic: Mild aortic atherosclerosis. No aneurysm. No suspicious lymph nodes. Reproductive: Uterus and bilateral adnexa are unremarkable. Other: Negative for pelvic effusion or free air. Musculoskeletal: No acute or suspicious osseous abnormality. There are degenerative changes IMPRESSION: 1. No CT evidence for acute intrathoracic, intra-abdominal, or intrapelvic abnormality allowing for absence of contrast 2. Hepatic steatosis 3. Diverticular disease of the left colon without  definitive inflammatory process. Electronically Signed   By: Jasmine Pang M.D.   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  AKI on CKD 3a  - hold losartan  - hydrate appropriately  - if there is a suspicion of concurrent plasma cell dyscrasia will consider biopsy  3.  Back pain  - lumbar MRI pending  - CT C/A/P was unrevealing yesterday in ED  - CK WNL  4.  Dispo: pending  Bufford Buttner 12/11/2021, 1:01 PM

## 2021-12-11 NOTE — Assessment & Plan Note (Signed)
Continue replacement therapy when med rec completed.

## 2021-12-12 LAB — CBC WITH DIFFERENTIAL/PLATELET
Abs Immature Granulocytes: 0.12 10*3/uL — ABNORMAL HIGH (ref 0.00–0.07)
Basophils Absolute: 0 10*3/uL (ref 0.0–0.1)
Basophils Relative: 0 %
Eosinophils Absolute: 0 10*3/uL (ref 0.0–0.5)
Eosinophils Relative: 0 %
HCT: 31.3 % — ABNORMAL LOW (ref 36.0–46.0)
Hemoglobin: 11 g/dL — ABNORMAL LOW (ref 12.0–15.0)
Immature Granulocytes: 1 %
Lymphocytes Relative: 10 %
Lymphs Abs: 1.1 10*3/uL (ref 0.7–4.0)
MCH: 30.6 pg (ref 26.0–34.0)
MCHC: 35.1 g/dL (ref 30.0–36.0)
MCV: 87.2 fL (ref 80.0–100.0)
Monocytes Absolute: 0.3 10*3/uL (ref 0.1–1.0)
Monocytes Relative: 3 %
Neutro Abs: 9.5 10*3/uL — ABNORMAL HIGH (ref 1.7–7.7)
Neutrophils Relative %: 86 %
Platelets: 176 10*3/uL (ref 150–400)
RBC: 3.59 MIL/uL — ABNORMAL LOW (ref 3.87–5.11)
RDW: 13.2 % (ref 11.5–15.5)
WBC: 11 10*3/uL — ABNORMAL HIGH (ref 4.0–10.5)
nRBC: 0 % (ref 0.0–0.2)

## 2021-12-12 LAB — MAGNESIUM: Magnesium: 2.3 mg/dL (ref 1.7–2.4)

## 2021-12-12 LAB — COMPREHENSIVE METABOLIC PANEL
ALT: 19 U/L (ref 0–44)
AST: 18 U/L (ref 15–41)
Albumin: 3.2 g/dL — ABNORMAL LOW (ref 3.5–5.0)
Alkaline Phosphatase: 69 U/L (ref 38–126)
Anion gap: 10 (ref 5–15)
BUN: 39 mg/dL — ABNORMAL HIGH (ref 8–23)
CO2: 19 mmol/L — ABNORMAL LOW (ref 22–32)
Calcium: 10.4 mg/dL — ABNORMAL HIGH (ref 8.9–10.3)
Chloride: 109 mmol/L (ref 98–111)
Creatinine, Ser: 3.69 mg/dL — ABNORMAL HIGH (ref 0.44–1.00)
GFR, Estimated: 13 mL/min — ABNORMAL LOW (ref >60)
Glucose, Bld: 161 mg/dL — ABNORMAL HIGH (ref 70–99)
Potassium: 3.9 mmol/L (ref 3.5–5.1)
Sodium: 138 mmol/L (ref 135–145)
Total Bilirubin: 0.5 mg/dL (ref 0.3–1.2)
Total Protein: 5.9 g/dL — ABNORMAL LOW (ref 6.5–8.1)

## 2021-12-12 LAB — CALCITRIOL (1,25 DI-OH VIT D): Vit D, 1,25-Dihydroxy: 128 pg/mL — ABNORMAL HIGH (ref 24.8–81.5)

## 2021-12-12 LAB — URINE CULTURE: Culture: <10000 — AB

## 2021-12-12 LAB — PTH, INTACT AND CALCIUM
Calcium, Total (PTH): 11 mg/dL — ABNORMAL HIGH (ref 8.7–10.3)
PTH: 19 pg/mL (ref 15–65)

## 2021-12-12 LAB — BRAIN NATRIURETIC PEPTIDE: B Natriuretic Peptide: 305.7 pg/mL — ABNORMAL HIGH (ref 0.0–100.0)

## 2021-12-12 LAB — ANGIOTENSIN CONVERTING ENZYME: Angiotensin-Converting Enzyme: 33 U/L (ref 14–82)

## 2021-12-12 MED ORDER — SODIUM CHLORIDE 0.9 % IV SOLN: INTRAVENOUS | Status: DC

## 2021-12-12 MED ORDER — THYROID 60 MG PO TABS
60.0000 mg | ORAL_TABLET | Freq: Every day | ORAL | Status: DC
  Administered 2021-12-13 – 2021-12-14 (×2): 60 mg via ORAL
  Filled 2021-12-12 (×2): qty 1

## 2021-12-13 ENCOUNTER — Inpatient Hospital Stay (HOSPITAL_COMMUNITY): Payer: Medicare Other

## 2021-12-13 LAB — CBC WITH DIFFERENTIAL/PLATELET
Abs Immature Granulocytes: 0.06 10*3/uL (ref 0.00–0.07)
Basophils Absolute: 0 10*3/uL (ref 0.0–0.1)
Basophils Relative: 0 %
Eosinophils Absolute: 0 10*3/uL (ref 0.0–0.5)
Eosinophils Relative: 0 %
HCT: 28.7 % — ABNORMAL LOW (ref 36.0–46.0)
Hemoglobin: 9.8 g/dL — ABNORMAL LOW (ref 12.0–15.0)
Immature Granulocytes: 1 %
Lymphocytes Relative: 12 %
Lymphs Abs: 1.2 10*3/uL (ref 0.7–4.0)
MCH: 30.3 pg (ref 26.0–34.0)
MCHC: 34.1 g/dL (ref 30.0–36.0)
MCV: 88.9 fL (ref 80.0–100.0)
Monocytes Absolute: 0.5 10*3/uL (ref 0.1–1.0)
Monocytes Relative: 5 %
Neutro Abs: 8.4 10*3/uL — ABNORMAL HIGH (ref 1.7–7.7)
Neutrophils Relative %: 82 %
Platelets: 186 10*3/uL (ref 150–400)
RBC: 3.23 MIL/uL — ABNORMAL LOW (ref 3.87–5.11)
RDW: 13.4 % (ref 11.5–15.5)
WBC: 10.1 10*3/uL (ref 4.0–10.5)
nRBC: 0 % (ref 0.0–0.2)

## 2021-12-13 LAB — COMPREHENSIVE METABOLIC PANEL
ALT: 28 U/L (ref 0–44)
AST: 29 U/L (ref 15–41)
Albumin: 3 g/dL — ABNORMAL LOW (ref 3.5–5.0)
Alkaline Phosphatase: 63 U/L (ref 38–126)
Anion gap: 6 (ref 5–15)
BUN: 44 mg/dL — ABNORMAL HIGH (ref 8–23)
CO2: 17 mmol/L — ABNORMAL LOW (ref 22–32)
Calcium: 9.7 mg/dL (ref 8.9–10.3)
Chloride: 114 mmol/L — ABNORMAL HIGH (ref 98–111)
Creatinine, Ser: 3.06 mg/dL — ABNORMAL HIGH (ref 0.44–1.00)
GFR, Estimated: 16 mL/min — ABNORMAL LOW (ref >60)
Glucose, Bld: 129 mg/dL — ABNORMAL HIGH (ref 70–99)
Potassium: 4.2 mmol/L (ref 3.5–5.1)
Sodium: 137 mmol/L (ref 135–145)
Total Bilirubin: 0.6 mg/dL (ref 0.3–1.2)
Total Protein: 5.4 g/dL — ABNORMAL LOW (ref 6.5–8.1)

## 2021-12-13 LAB — BRAIN NATRIURETIC PEPTIDE: B Natriuretic Peptide: 532.2 pg/mL — ABNORMAL HIGH (ref 0.0–100.0)

## 2021-12-13 LAB — CALCIUM, IONIZED: Calcium, Ionized, Serum: 7.5 mg/dL — ABNORMAL HIGH (ref 4.5–5.6)

## 2021-12-13 LAB — MAGNESIUM: Magnesium: 2 mg/dL (ref 1.7–2.4)

## 2021-12-13 MED ORDER — FUROSEMIDE 20 MG PO TABS
20.0000 mg | ORAL_TABLET | Freq: Once | ORAL | Status: AC
  Administered 2021-12-13: 20 mg via ORAL
  Filled 2021-12-13: qty 1

## 2021-12-13 MED ORDER — POLYETHYLENE GLYCOL 3350 17 G PO PACK
17.0000 g | PACK | Freq: Two times a day (BID) | ORAL | Status: DC
  Administered 2021-12-13 – 2021-12-14 (×2): 17 g via ORAL
  Filled 2021-12-13 (×2): qty 1

## 2021-12-13 MED ORDER — DOCUSATE SODIUM 100 MG PO CAPS
200.0000 mg | ORAL_CAPSULE | Freq: Two times a day (BID) | ORAL | Status: DC
  Administered 2021-12-13 – 2021-12-14 (×3): 200 mg via ORAL
  Filled 2021-12-13 (×4): qty 2

## 2021-12-13 MED ORDER — HYDRALAZINE HCL 50 MG PO TABS
50.0000 mg | ORAL_TABLET | Freq: Three times a day (TID) | ORAL | Status: DC
  Administered 2021-12-13 – 2021-12-14 (×4): 50 mg via ORAL
  Filled 2021-12-13 (×4): qty 1

## 2021-12-13 MED ORDER — CALCITONIN (SALMON) 200 UNIT/ACT NA SOLN
1.0000 | Freq: Every day | NASAL | Status: DC
  Administered 2021-12-13: 1 via NASAL
  Filled 2021-12-13: qty 3.7

## 2021-12-13 MED ORDER — LACTULOSE 10 GM/15ML PO SOLN
30.0000 g | Freq: Two times a day (BID) | ORAL | Status: DC
Start: 2021-12-13 — End: 2021-12-14
  Administered 2021-12-13: 30 g via ORAL
  Filled 2021-12-13 (×2): qty 45

## 2021-12-13 MED ORDER — CARVEDILOL 6.25 MG PO TABS
6.2500 mg | ORAL_TABLET | Freq: Two times a day (BID) | ORAL | Status: DC
Start: 2021-12-13 — End: 2021-12-14
  Administered 2021-12-13 – 2021-12-14 (×3): 6.25 mg via ORAL
  Filled 2021-12-13 (×3): qty 1

## 2021-12-13 NOTE — Progress Notes (Signed)
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  AKI on CKD 3a             - hold losartan             - hydrate appropriately             - if there is a suspicion of concurrent plasma cell dyscrasia will consider biopsy   3.  Back pain             - lumbar MRI with severe facet arthropathy             - CT C/A/P was unrevealing yesterday in ED             - CK WNL   4.  Dispo: pending  Subjective:    Feeling well.  Sitting up in bed, eating lunch.  Son at the bedside.     Objective:   BP (!) 179/82   Pulse 81   Temp 97.7 F (36.5 C) (Oral)   Resp 18   Ht 5\' 2"  (1.575 m)   Wt 85 kg   SpO2 97%   BMI 34.27 kg/m   Intake/Output Summary (Last 24 hours) at 12/13/2021 1312 Last data filed at 12/13/2021 0800 Gross per 24 hour  Intake 1360 ml  Output --  Net 1360 ml   Weight change:   Physical Exam: GEN NAD, sitting in bed HEENT EOMI PERRL NECK + faint rash underneath chin, no JVD PULM clear bilaterally CV RRR no m/r/g ABD soft, nondistended EXT no LE edema NEURO AAO x 3 nonfocal SKIN faint rash underneath chin as above  Imaging: DG Chest Port 1 View  Result Date: 12/13/2021 CLINICAL DATA:  Hypercalcemia with shortness of breath EXAM:  PORTABLE CHEST 1 VIEW COMPARISON:  12/10/2021 chest CT FINDINGS: Normal heart size. Aortic tortuosity. There is no edema, consolidation, effusion, or pneumothorax. No acute osseous finding. IMPRESSION: No evidence of acute disease. Electronically Signed   By: Tiburcio Pea M.D.   On: 12/13/2021 07:34   MR LUMBAR SPINE WO CONTRAST  Result Date: 12/11/2021 CLINICAL DATA:  Lumbar compression fracture.  Left lower back pain. EXAM: MRI LUMBAR SPINE WITHOUT CONTRAST TECHNIQUE: Multiplanar, multisequence MR imaging of the lumbar spine was performed. No intravenous contrast was administered. COMPARISON:  CT chest, abdomen, and  pelvis 12/10/2021 FINDINGS: Segmentation:  Standard. Alignment:  Normal. Vertebrae: No fracture or suspicious marrow lesion. Mild endplate edema at Z61-W9 and L1-2, degenerative in appearance. Conus medullaris and cauda equina: Conus extends to the lower L2 level. Conus and cauda equina appear normal. Paraspinal and other soft tissues: Edema throughout the left greater than right posterior lumbar paraspinal musculature, greatest in the lower lumbar spine. Disc levels: Disc desiccation from T12-L1 to L3-4 with associated disc space narrowing which is most severe at T12-L1 and L1-2. T12-L1: Mild disc bulging without stenosis. L1-2: Disc bulging and mild facet and ligamentum flavum hypertrophy result in borderline to mild bilateral lateral recess stenosis and mild right neural foraminal stenosis without spinal stenosis. L2-3: Disc bulging and mild facet and ligamentum flavum hypertrophy result in borderline to mild bilateral lateral recess stenosis without spinal or neural foraminal stenosis. L3-4: Disc bulging, a left subarticular to left foraminal disc protrusion, and moderate left greater than right facet and ligamentum flavum hypertrophy result in mild spinal stenosis, mild right and mild-to-moderate left lateral recess stenosis, and borderline to mild right and mild-to-moderate left neural  foraminal stenosis. Potential left L4 nerve root impingement. L4-5: Disc bulging, mild ligamentum flavum hypertrophy, and severe facet hypertrophy result in mild spinal stenosis, mild bilateral lateral recess stenosis, and mild-to-moderate bilateral neural foraminal stenosis. There are left larger than right facet joint effusions with synovial cysts posterior to both facet joints including a 1.6 cm cyst on the left. L5-S1: Severe facet hypertrophy without stenosis. IMPRESSION: 1. Severe lower lumbar facet arthrosis. Left larger than right facet joint effusions and posteriorly positioned synovial cysts at L4-5 with edema in the surrounding soft tissues. No significant marrow edema or bone destruction to specifically suggest infection, and this may reflect an acute exacerbation of degenerative facet arthritis. 2. Mild spinal stenosis and mild-to-moderate left lateral recess and neural foraminal stenosis at L3-4. 3. Mild spinal stenosis and mild-to-moderate bilateral foraminal stenosis at L4-5. Electronically Signed   By: Sebastian Ache M.D.   On: 12/11/2021 13:32    Labs: BMET Recent Labs  Lab 12/10/21 2005 12/10/21 2052 12/11/21 0628 12/12/21 0142 12/13/21 0227  NA 138  --  141 138 137  K 3.6  --  3.2* 3.9 4.2  CL 102  --  109 109 114*  CO2 26  --  21* 19* 17*  GLUCOSE 111*  --  122* 161* 129*  BUN 45*  --  38* 39* 44*  CREATININE 3.98*  --  4.02* 3.69* 3.06*  CALCIUM 13.1*  --  11.4*  11.0* 10.4* 9.7  PHOS  --  3.2  --   --   --    CBC Recent Labs  Lab 12/10/21 2005 12/11/21 0628 12/12/21 0142 12/13/21 0227  WBC 12.3* 10.4 11.0* 10.1  NEUTROABS 9.1*  --  9.5* 8.4*  HGB 13.8 11.2* 11.0* 9.8*  HCT 39.5 33.1* 31.3* 28.7*  MCV 86.1 87.1 87.2 88.9  PLT 211 176 176 186    Medications:     calcitonin (salmon)  1 spray Alternating Nares Daily   carvedilol  6.25 mg Oral BID WC   dexamethasone (DECADRON) injection  4 mg Intravenous Q24H   docusate sodium  200 mg Oral BID   enoxaparin  (LOVENOX) injection  30 mg Subcutaneous Q24H   hydrALAZINE  50 mg Oral Q8H   lactulose  30 g Oral BID   pantoprazole  40 mg Oral Daily   polyethylene glycol  17 g Oral BID   thyroid  60 mg Oral Q0600      Bufford Buttner, MD 12/13/2021, 1:12 PM

## 2021-12-13 NOTE — Progress Notes (Signed)
PROGRESS NOTE                                                                                                                                                                                                             Patient Demographics:    Lori Graham, is a 71 y.o. female, DOB - [birth year], ONG:[id number]  Outpatient Primary MD for the patient is Yolanda Manges, FNP    LOS - 2  Admit date - 12/10/2021    Chief Complaint  Patient presents with   multple complaints       Brief Narrative (HPI from H&P)   71 y.o. female with medical history significant of HTN, HLD, CKD 3a (creat 1.27 as of March 2023).  She presented to the ER with chief complaints of left-sided lower back pain, nausea, muscle aches and fatigue, she also noted her urine was getting darker.  In the ER she was diagnosed with severe dehydration, hypercalcemia, AKI and admitted to the hospital.   Subjective:   Patient in bed, appears comfortable, denies any headache, no fever, no chest pain or pressure, no shortness of breath , no abdominal pain. No focal weakness.   Assessment  & Plan :    1.  Fatigue, dehydration, AKI caused by severe hypercalcemia.  Home medications now revealed that she was taking extremely high doses of oral vitamin D along with multiple supplements which could be potentially causing severe hypercalcemia along with osmolar diuresis and AKI, extremely high vitamin D levels, stable TSH, pending PTH, PTH RP, ACE inhibitor levels, SPEP and UPEP, hydrate aggressively for now, salmon calcitonin 4 doses, once she is adequately hydrated and renal function has stabilized we will try Lasix, bisphosphonates will be tried if all fails, nephrology following, renal ultrasound stable.  Hypercalcemia gradually improving, mild improvement in AKI as well.  Continue to monitor.  2.  Hypertension.  Hold ARB due to AKI, placed on Coreg and will add Norvasc,  continue needed hydralazine.  3.  Hypothyroidism.  On home dose Synthroid, stable TSH.  4.  Left lower back pain.  L-spine MRI noted discussed with neurosurgeon Dr. Johnsie Cancel, short course of steroid, PT OT and follow-up with neurosurgery in the outpatient clinic postdischarge.  5.  Obesity.  BMI of 34.  Follow-up with PCP.  6.  Constipation.  Due to hypercalcemia.  Placed  on appropriate medications.      Condition - Extremely Guarded  Family Communication  : Daughter-in-law at bedside on 12/11/2021  Code Status : Full code  Consults  : Nephrology  PUD Prophylaxis : PPI   Procedures  :     CT -  1. No CT evidence for acute intrathoracic, intra-abdominal, or intrapelvic abnormality allowing for absence of contrast 2. Hepatic steatosis 3. 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  MRI L Spine - 1. Severe lower lumbar facet arthrosis. Left larger than right facet joint effusions and posteriorly positioned synovial cysts at L4-5 with edema in the surrounding soft tissues. No significant marrow edema or bone destruction to specifically suggest infection, and this may reflect an acute exacerbation of degenerative facet arthritis. 2. Mild spinal stenosis and mild-to-moderate left lateral recess and neural foraminal stenosis at L3-4. 3. Mild spinal stenosis and mild-to-moderate bilateral foraminal stenosis at L4-5.   Renal US - Non acute       Disposition Plan  :    Status is: Observation  DVT Prophylaxis  :    enoxaparin (LOVENOX) injection 30 mg Start: 12/11/21 1400    Lab Results  Component Value Date   PLT 186 12/13/2021    Diet :  Diet Order             Diet Heart Room service appropriate? Yes; Fluid consistency: Thin  Diet effective now                    Inpatient Medications  Scheduled Meds:  carvedilol  6.25 mg Oral BID WC   dexamethasone (DECADRON) injection  4 mg Intravenous Q24H   docusate sodium  200 mg Oral  BID   enoxaparin (LOVENOX) injection  30 mg Subcutaneous Q24H   hydrALAZINE  50 mg Oral Q8H   lactulose  30 g Oral BID   pantoprazole  40 mg Oral Daily   polyethylene glycol  17 g Oral BID   thyroid  60 mg Oral Q0600   Continuous Infusions:  sodium chloride 50 mL/hr at 12/13/21 0757   PRN Meds:.acetaminophen **OR** acetaminophen, hydrALAZINE, ondansetron **OR** ondansetron (ZOFRAN) IV, traMADol  Antibiotics  :    Anti-infectives (From admission, onward)    None        Time Spent in minutes  30   Susa Raring M.D on 12/13/2021 at 10:45 AM  To page go to [email or web address]   [facility] -  Office  [phone]  See all Orders from today for further details    Objective:   Vitals:   12/12/21 1713 12/12/21 2104 12/13/21 0549 12/13/21 0815  BP: 137/83 (!) 160/75 (!) 189/90 (!) 179/82  Pulse: (!) 45 82 93 81  Resp: 18 18 (!) 22 18  Temp: 98.3 F (36.8 C) 98.1 F (36.7 C) 98.6 F (37 C) 97.7 F (36.5 C)  TempSrc: Oral Oral Oral Oral  SpO2: 97% 98% 97% 97%  Weight:      Height:        Wt Readings from Last 3 Encounters:  12/11/21 85 kg     Intake/Output Summary (Last 24 hours) at 12/13/2021 1045 Last data filed at 12/13/2021 0800 Gross per 24 hour  Intake 1600 ml  Output --  Net 1600 ml     Physical Exam  Awake Alert, No new F.N deficits, Normal affect [place].AT,PERRAL Supple Neck, No JVD,   Symmetrical Chest wall movement, Good air movement bilaterally, CTAB RRR,No Gallops, Rubs or new  Murmurs,  +ve B.Sounds, Abd Soft, No tenderness,   No Cyanosis, Clubbing or edema        Data Review:    CBC Recent Labs  Lab 12/10/21 2005 12/11/21 0628 12/12/21 0142 12/13/21 0227  WBC 12.3* 10.4 11.0* 10.1  HGB 13.8 11.2* 11.0* 9.8*  HCT 39.5 33.1* 31.3* 28.7*  PLT 211 176 176 186  MCV 86.1 87.1 87.2 88.9  MCH 30.1 29.5 30.6 30.3  MCHC 34.9 33.8 35.1 34.1  RDW 13.2 13.2 13.2 13.4  LYMPHSABS 2.1  --  1.1 1.2  MONOABS 1.0  --  0.3 0.5  EOSABS 0.0   --  0.0 0.0  BASOSABS 0.0  --  0.0 0.0    Electrolytes Recent Labs  Lab 12/10/21 2005 12/10/21 2052 12/10/21 2108 12/10/21 2256 12/11/21 0628 12/12/21 0142 12/13/21 0227  NA 138  --   --   --  141 138 137  K 3.6  --   --   --  3.2* 3.9 4.2  CL 102  --   --   --  109 109 114*  CO2 26  --   --   --  21* 19* 17*  GLUCOSE 111*  --   --   --  122* 161* 129*  BUN 45*  --   --   --  38* 39* 44*  CREATININE 3.98*  --   --   --  4.02* 3.69* 3.06*  CALCIUM 13.1*  --   --   --  11.4*  11.0* 10.4* 9.7  AST 24  --   --   --   --  18 29  ALT 22  --   --   --   --  19 28  ALKPHOS 97  --   --   --   --  69 63  BILITOT 0.9  --   --   --   --  0.5 0.6  ALBUMIN 4.6  --   --   --   --  3.2* 3.0*  MG  --  3.4*  --   --   --  2.3 2.0  LATICACIDVEN  --   --  1.3 1.5  --   --   --   TSH  --   --   --   --  0.807  --   --   BNP  --   --   --   --   --  305.7* 532.2*    Radiology Reports DG Chest Port 1 View  Result Date: 12/13/2021 CLINICAL DATA:  Hypercalcemia with shortness of breath EXAM: PORTABLE CHEST 1 VIEW COMPARISON:  12/10/2021 chest CT FINDINGS: Normal heart size. Aortic tortuosity. There is no edema, consolidation, effusion, or pneumothorax. No acute osseous finding. IMPRESSION: No evidence of acute disease. Electronically Signed   By: Tiburcio Pea M.D.   On: 12/13/2021 07:34   MR LUMBAR SPINE WO CONTRAST  Result Date: 12/11/2021 CLINICAL DATA:  Lumbar compression fracture.  Left lower back pain. EXAM: MRI LUMBAR SPINE WITHOUT CONTRAST TECHNIQUE: Multiplanar, multisequence MR imaging of the lumbar spine was performed. No intravenous contrast was administered. COMPARISON:  CT chest, abdomen, and pelvis 12/10/2021 FINDINGS: Segmentation:  Standard. Alignment:  Normal. Vertebrae: No fracture or suspicious marrow lesion. Mild endplate edema at Z61-W9 and L1-2, degenerative in appearance. Conus medullaris and cauda equina: Conus extends to the lower L2 level. Conus and cauda equina appear  normal. Paraspinal and other soft tissues: Edema throughout the left  greater than right posterior lumbar paraspinal musculature, greatest in the lower lumbar spine. Disc levels: Disc desiccation from T12-L1 to L3-4 with associated disc space narrowing which is most severe at T12-L1 and L1-2. T12-L1: Mild disc bulging without stenosis. L1-2: Disc bulging and mild facet and ligamentum flavum hypertrophy result in borderline to mild bilateral lateral recess stenosis and mild right neural foraminal stenosis without spinal stenosis. L2-3: Disc bulging and mild facet and ligamentum flavum hypertrophy result in borderline to mild bilateral lateral recess stenosis without spinal or neural foraminal stenosis. L3-4: Disc bulging, a left subarticular to left foraminal disc protrusion, and moderate left greater than right facet and ligamentum flavum hypertrophy result in mild spinal stenosis, mild right and mild-to-moderate left lateral recess stenosis, and borderline to mild right and mild-to-moderate left neural foraminal stenosis. Potential left L4 nerve root impingement. L4-5: Disc bulging, mild ligamentum flavum hypertrophy, and severe facet hypertrophy result in mild spinal stenosis, mild bilateral lateral recess stenosis, and mild-to-moderate bilateral neural foraminal stenosis. There are left larger than right facet joint effusions with synovial cysts posterior to both facet joints including a 1.6 cm cyst on the left. L5-S1: Severe facet hypertrophy without stenosis. IMPRESSION: 1. Severe lower lumbar facet arthrosis. Left larger than right facet joint effusions and posteriorly positioned synovial cysts at L4-5 with edema in the surrounding soft tissues. No significant marrow edema or bone destruction to specifically suggest infection, and this may reflect an acute exacerbation of degenerative facet arthritis. 2. Mild spinal stenosis and mild-to-moderate left lateral recess and neural foraminal stenosis at L3-4. 3. Mild  spinal stenosis and mild-to-moderate bilateral foraminal stenosis at L4-5. Electronically Signed   By: Sebastian Ache M.D.   On: 12/11/2021 13:32   US RENAL  Result Date: 12/11/2021 CLINICAL DATA:  Acute kidney injury EXAM: RENAL / URINARY TRACT ULTRASOUND COMPLETE COMPARISON:  CT 12/10/2021 FINDINGS: Right Kidney: Renal measurements: 10.4 x 4.3 x 4.9 cm = volume: 114 mL. Echogenicity within normal limits. No mass or hydronephrosis visualized. Left Kidney: Renal measurements: 11.3 x 4.8 x 4.9 cm = volume: 140 mL. Echogenicity within normal limits. No mass or hydronephrosis visualized. Bladder: Appears normal for degree of bladder distention. Other: Hepatic steatosis. IMPRESSION: No evidence of obstructive uropathy. Electronically Signed   By: Duanne Guess D.O.   On: 12/11/2021 11:43   CT CHEST ABDOMEN PELVIS WO CONTRAST  Result Date: 12/10/2021 CLINICAL DATA:  Left lower quadrant pain EXAM: CT CHEST, ABDOMEN AND PELVIS WITHOUT CONTRAST TECHNIQUE: Multidetector CT imaging of the chest, abdomen and pelvis was performed following the standard protocol without IV contrast. RADIATION DOSE REDUCTION: This exam was performed according to the departmental dose-optimization program which includes automated exposure control, adjustment of the mA and/or kV according to patient size and/or use of iterative reconstruction technique. COMPARISON:  None Available. FINDINGS: CT CHEST FINDINGS Cardiovascular: Limited evaluation without intravenous contrast. Mild atherosclerosis. No aneurysm. Mild coronary vascular calcification. Normal cardiac size. Small pericardial effusion. Mediastinum/Nodes: Midline trachea. No thyroid mass. No suspicious lymph nodes. Esophagus within normal limits Lungs/Pleura: Lungs are clear. No pleural effusion or pneumothorax. Musculoskeletal: No chest wall mass or suspicious bone lesions identified. CT ABDOMEN PELVIS FINDINGS Hepatobiliary: Hepatic steatosis. Status post cholecystectomy. No  biliary dilatation. Pancreas: Unremarkable. No pancreatic ductal dilatation or surrounding inflammatory changes. Spleen: Normal in size without focal abnormality. Adrenals/Urinary Tract: Adrenal glands are unremarkable. Kidneys are normal, without renal calculi, focal lesion, or hydronephrosis. Bladder is unremarkable. Stomach/Bowel: Stomach is within normal limits. Appendix not well seen. Diverticular disease  of the left colon without definitive inflammation. No evidence of bowel wall thickening, distention, or inflammatory changes. Vascular/Lymphatic: Mild aortic atherosclerosis. No aneurysm. No suspicious lymph nodes. Reproductive: Uterus and bilateral adnexa are unremarkable. Other: Negative for pelvic effusion or free air. Musculoskeletal: No acute or suspicious osseous abnormality. There are degenerative changes IMPRESSION: 1. No CT evidence for acute intrathoracic, intra-abdominal, or intrapelvic abnormality allowing for absence of contrast 2. Hepatic steatosis 3. 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 Electronically Signed   By: Jasmine Pang M.D.   On: 12/10/2021 21:29

## 2021-12-14 ENCOUNTER — Other Ambulatory Visit (HOSPITAL_COMMUNITY): Payer: Self-pay

## 2021-12-14 LAB — CBC WITH DIFFERENTIAL/PLATELET
Abs Immature Granulocytes: 0.06 10*3/uL (ref 0.00–0.07)
Basophils Absolute: 0 10*3/uL (ref 0.0–0.1)
Basophils Relative: 0 %
Eosinophils Absolute: 0 10*3/uL (ref 0.0–0.5)
Eosinophils Relative: 0 %
HCT: 30 % — ABNORMAL LOW (ref 36.0–46.0)
Hemoglobin: 10.2 g/dL — ABNORMAL LOW (ref 12.0–15.0)
Immature Granulocytes: 1 %
Lymphocytes Relative: 15 %
Lymphs Abs: 1.5 10*3/uL (ref 0.7–4.0)
MCH: 29.8 pg (ref 26.0–34.0)
MCHC: 34 g/dL (ref 30.0–36.0)
MCV: 87.7 fL (ref 80.0–100.0)
Monocytes Absolute: 0.5 10*3/uL (ref 0.1–1.0)
Monocytes Relative: 5 %
Neutro Abs: 8 10*3/uL — ABNORMAL HIGH (ref 1.7–7.7)
Neutrophils Relative %: 79 %
Platelets: 209 10*3/uL (ref 150–400)
RBC: 3.42 MIL/uL — ABNORMAL LOW (ref 3.87–5.11)
RDW: 13.4 % (ref 11.5–15.5)
WBC: 10.1 10*3/uL (ref 4.0–10.5)
nRBC: 0 % (ref 0.0–0.2)

## 2021-12-14 LAB — PROTEIN ELECTROPHORESIS, SERUM
A/G Ratio: 1.2 (ref 0.7–1.7)
Albumin ELP: 3.3 g/dL (ref 2.9–4.4)
Alpha-1-Globulin: 0.3 g/dL (ref 0.0–0.4)
Alpha-2-Globulin: 0.8 g/dL (ref 0.4–1.0)
Beta Globulin: 1 g/dL (ref 0.7–1.3)
Gamma Globulin: 0.8 g/dL (ref 0.4–1.8)
Globulin, Total: 2.8 g/dL (ref 2.2–3.9)
Total Protein ELP: 6.1 g/dL (ref 6.0–8.5)

## 2021-12-14 LAB — COMPREHENSIVE METABOLIC PANEL
ALT: 54 U/L — ABNORMAL HIGH (ref 0–44)
AST: 35 U/L (ref 15–41)
Albumin: 3.2 g/dL — ABNORMAL LOW (ref 3.5–5.0)
Alkaline Phosphatase: 74 U/L (ref 38–126)
Anion gap: 8 (ref 5–15)
BUN: 43 mg/dL — ABNORMAL HIGH (ref 8–23)
CO2: 17 mmol/L — ABNORMAL LOW (ref 22–32)
Calcium: 10 mg/dL (ref 8.9–10.3)
Chloride: 112 mmol/L — ABNORMAL HIGH (ref 98–111)
Creatinine, Ser: 2.69 mg/dL — ABNORMAL HIGH (ref 0.44–1.00)
GFR, Estimated: 18 mL/min — ABNORMAL LOW (ref >60)
Glucose, Bld: 120 mg/dL — ABNORMAL HIGH (ref 70–99)
Potassium: 3.9 mmol/L (ref 3.5–5.1)
Sodium: 137 mmol/L (ref 135–145)
Total Bilirubin: 0.4 mg/dL (ref 0.3–1.2)
Total Protein: 5.7 g/dL — ABNORMAL LOW (ref 6.5–8.1)

## 2021-12-14 LAB — MAGNESIUM: Magnesium: 1.8 mg/dL (ref 1.7–2.4)

## 2021-12-14 LAB — KAPPA/LAMBDA LIGHT CHAINS
Kappa free light chain: 28 mg/L — ABNORMAL HIGH (ref 3.3–19.4)
Kappa, lambda light chain ratio: 1.28 (ref 0.26–1.65)
Lambda free light chains: 21.9 mg/L (ref 5.7–26.3)

## 2021-12-14 LAB — BRAIN NATRIURETIC PEPTIDE: B Natriuretic Peptide: 677.4 pg/mL — ABNORMAL HIGH (ref 0.0–100.0)

## 2021-12-14 MED ORDER — AMLODIPINE BESYLATE 10 MG PO TABS
10.0000 mg | ORAL_TABLET | Freq: Every day | ORAL | 0 refills | Status: AC
  Filled 2021-12-14: qty 30, 30d supply, fill #0

## 2021-12-14 MED ORDER — POLYETHYLENE GLYCOL 3350 17 GM/SCOOP PO POWD
17.0000 g | Freq: Every day | ORAL | 0 refills | Status: AC
  Filled 2021-12-14: qty 238, 14d supply, fill #0

## 2021-12-14 MED ORDER — DEXAMETHASONE SODIUM PHOSPHATE 4 MG/ML IJ SOLN
2.0000 mg | INTRAMUSCULAR | Status: DC
  Filled 2021-12-14: qty 0.5

## 2021-12-14 MED ORDER — CARVEDILOL 6.25 MG PO TABS
6.2500 mg | ORAL_TABLET | Freq: Two times a day (BID) | ORAL | 0 refills | Status: AC
  Filled 2021-12-14: qty 60, 30d supply, fill #0

## 2021-12-14 MED ORDER — PROSOURCE PLUS PO LIQD
30.0000 mL | Freq: Two times a day (BID) | ORAL | Status: DC
  Administered 2021-12-14: 30 mL via ORAL
  Filled 2021-12-14: qty 30

## 2021-12-14 MED ORDER — METHYLPREDNISOLONE 4 MG PO TBPK
ORAL_TABLET | ORAL | 0 refills | Status: DC
  Filled 2021-12-14: qty 21, 6d supply, fill #0

## 2021-12-14 MED ORDER — FUROSEMIDE 20 MG PO TABS
20.0000 mg | ORAL_TABLET | Freq: Once | ORAL | Status: AC
Start: 2021-12-14 — End: 2021-12-14
  Administered 2021-12-14: 20 mg via ORAL
  Filled 2021-12-14: qty 1

## 2021-12-14 NOTE — Care Management Important Message (Signed)
Important Message  Patient Details  Name: Lori Graham MRN: [id number] Date of Birth: 1951/01/04   Medicare Important Message Given:  Yes  Patient left prior to IM delivery will mail to the home address.    Demonta Wombles 12/14/2021, 1:44 PM

## 2021-12-14 NOTE — Progress Notes (Signed)
[facility] Progress Note    Assessment/ Plan:   Hypercalcemia: abrupt rise in Ca in the setting of likely excessive Vit D supplementation but complicated by AKI, back pain, and no recent cancer screening per pt -Work-up thus far with suppressed PTH at 19 and markedly elevated vitamin D levels consistent with excessive consumption of vitamin D supplementation. -Calcium has improved.  Patient is stable for discharge from nephrology standpoint and we will sign off.  Would follow-up myeloma panel, SPEP, light chains, PTH RP outpatient. -Avoid vitamin D and Tums.  Encourage good hydration of the next few days   2.  AKI on CKD 3a             - hold losartan             - hydrate appropriately             -Continues to improve with time.  Monitor labs outpatient and refer to nephrology if creatinine does not go back down to baseline.   3.  Back pain             - lumbar MRI with severe facet arthropathy             - CT C/A/P was unrevealing yesterday in ED             - CK WNL   4.  Dispo: Stable for discharge from nephrology standpoint.  We will sign off  Subjective:    Patient feels well today.  Urinating well.  No complaints.   Objective:   BP (!) 127/94 (BP Location: Left Arm)   Pulse 87   Temp 97.6 F (36.4 C) (Oral)   Resp 17   Ht 5\' 2"  (1.575 m)   Wt 85 kg   SpO2 97%   BMI 34.27 kg/m   Intake/Output Summary (Last 24 hours) at 12/14/2021 1035 Last data filed at 12/14/2021 0800 Gross per 24 hour  Intake 954 ml  Output 250 ml  Net 704 ml   Weight change:   Physical Exam: GEN NAD, sitting in bed HEENT EOMI PERRL NECK trachea midline PULM clear bilaterally bilateral chest rise CV normal rate, no rub ABD soft, nondistended EXT no LE edema, warm and well perfused NEURO AAO x 3 nonfocal  Imaging: DG Chest Port 1 View  Result Date: 12/13/2021 CLINICAL DATA:  Hypercalcemia with shortness of breath EXAM: PORTABLE CHEST 1 VIEW COMPARISON:  12/10/2021  chest CT FINDINGS: Normal heart size. Aortic tortuosity. There is no edema, consolidation, effusion, or pneumothorax. No acute osseous finding. IMPRESSION: No evidence of acute disease. Electronically Signed   By: Tiburcio Pea M.D.   On: 12/13/2021 07:34    Labs: BMET Recent Labs  Lab 12/10/21 2005 12/10/21 2052 12/11/21 3244 12/12/21 0142 12/13/21 0227 12/14/21 0252  NA 138  --  141 138 137 137  K 3.6  --  3.2* 3.9 4.2 3.9  CL 102  --  109 109 114* 112*  CO2 26  --  21* 19* 17* 17*  GLUCOSE 111*  --  122* 161* 129* 120*  BUN 45*  --  38* 39* 44* 43*  CREATININE 3.98*  --  4.02* 3.69* 3.06* 2.69*  CALCIUM 13.1*  --  11.4*  11.0* 10.4* 9.7 10.0  PHOS  --  3.2  --   --   --   --    CBC Recent Labs  Lab 12/10/21 2005 12/11/21 0102 12/12/21 0142 12/13/21 0227 12/14/21 0252  WBC 12.3* 10.4 11.0* 10.1 10.1  NEUTROABS 9.1*  --  9.5* 8.4* 8.0*  HGB 13.8 11.2* 11.0* 9.8* 10.2*  HCT 39.5 33.1* 31.3* 28.7* 30.0*  MCV 86.1 87.1 87.2 88.9 87.7  PLT 211 176 176 186 209    Medications:     (feeding supplement) PROSource Plus  30 mL Oral BID BM   calcitonin (salmon)  1 spray Alternating Nares Daily   carvedilol  6.25 mg Oral BID WC   dexamethasone (DECADRON) injection  2 mg Intravenous Q24H   docusate sodium  200 mg Oral BID   enoxaparin (LOVENOX) injection  30 mg Subcutaneous Q24H   hydrALAZINE  50 mg Oral Q8H   pantoprazole  40 mg Oral Daily   polyethylene glycol  17 g Oral BID   thyroid  60 mg Oral Q0600     Darnell Level  12/14/2021, 10:35 AM

## 2021-12-15 LAB — PARATHYROID HORMONE, INTACT (NO CA): PTH: 15 pg/mL (ref 15–65)

## 2021-12-16 LAB — MULTIPLE MYELOMA PANEL, SERUM
Albumin SerPl Elph-Mcnc: 3.4 g/dL (ref 2.9–4.4)
Albumin/Glob SerPl: 1.3 (ref 0.7–1.7)
Alpha 1: 0.3 g/dL (ref 0.0–0.4)
Alpha2 Glob SerPl Elph-Mcnc: 0.7 g/dL (ref 0.4–1.0)
B-Globulin SerPl Elph-Mcnc: 0.9 g/dL (ref 0.7–1.3)
Gamma Glob SerPl Elph-Mcnc: 0.8 g/dL (ref 0.4–1.8)
Globulin, Total: 2.7 g/dL (ref 2.2–3.9)
IgA: 114 mg/dL (ref 87–352)
IgG (Immunoglobin G), Serum: 600 mg/dL (ref 586–1602)
IgM (Immunoglobulin M), Srm: 299 mg/dL — ABNORMAL HIGH (ref 26–217)
Total Protein ELP: 6.1 g/dL (ref 6.0–8.5)

## 2021-12-19 LAB — PTH-RELATED PEPTIDE: PTH-related peptide: <2 pmol/L

## 2022-01-04 ENCOUNTER — Other Ambulatory Visit (HOSPITAL_COMMUNITY): Payer: Self-pay

## 2022-01-11 ENCOUNTER — Other Ambulatory Visit (HOSPITAL_COMMUNITY): Payer: Self-pay

## 2022-01-11 ENCOUNTER — Other Ambulatory Visit: Payer: Self-pay

## 2022-02-05 ENCOUNTER — Other Ambulatory Visit (HOSPITAL_COMMUNITY): Payer: Self-pay

## 2022-02-05 ENCOUNTER — Other Ambulatory Visit: Payer: Self-pay

## 2022-03-03 ENCOUNTER — Other Ambulatory Visit (HOSPITAL_COMMUNITY): Payer: Self-pay

## 2022-03-16 ENCOUNTER — Other Ambulatory Visit (HOSPITAL_COMMUNITY): Payer: Self-pay

## 2022-04-06 ENCOUNTER — Other Ambulatory Visit (HOSPITAL_BASED_OUTPATIENT_CLINIC_OR_DEPARTMENT_OTHER): Payer: Self-pay

## 2022-05-10 ENCOUNTER — Other Ambulatory Visit (HOSPITAL_COMMUNITY): Payer: Self-pay

## 2023-06-24 ENCOUNTER — Other Ambulatory Visit (HOSPITAL_COMMUNITY): Payer: Self-pay

## 2023-07-23 IMAGING — US US RENAL
1 series · 14 of 21 positions shown · non-contrast
Comparison: CT 12/10/2021

CLINICAL DATA: Acute kidney injury

EXAM:
RENAL / URINARY TRACT ULTRASOUND COMPLETE

[Series 1: us renal · 14 of 21 slices shown]
[im 1/21]
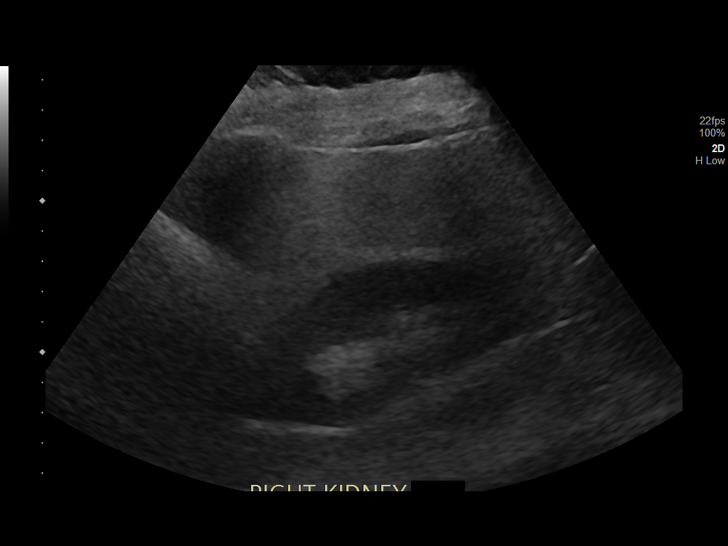
[im 3/21]
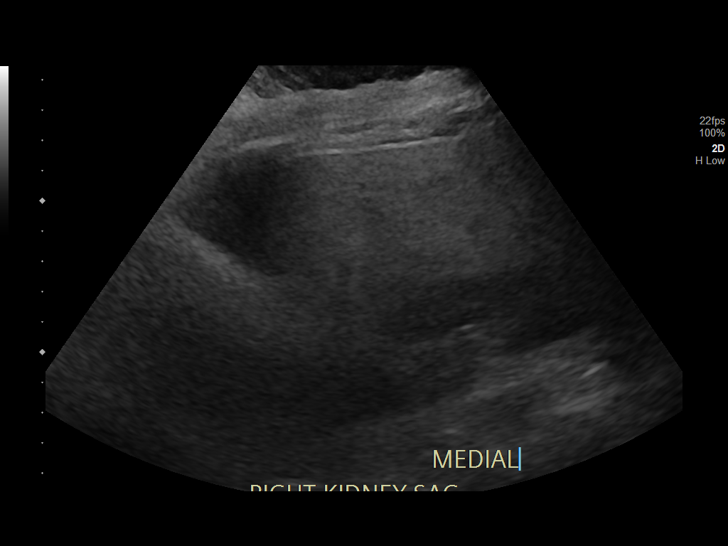
[im 4/21]
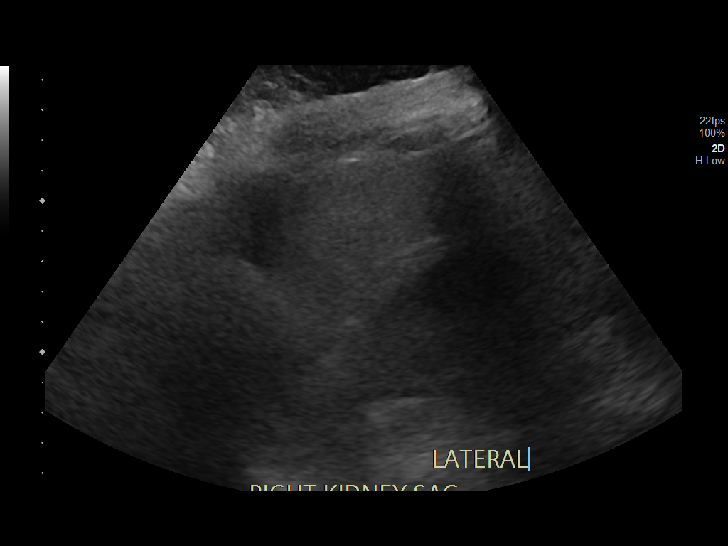
[im 6/21]
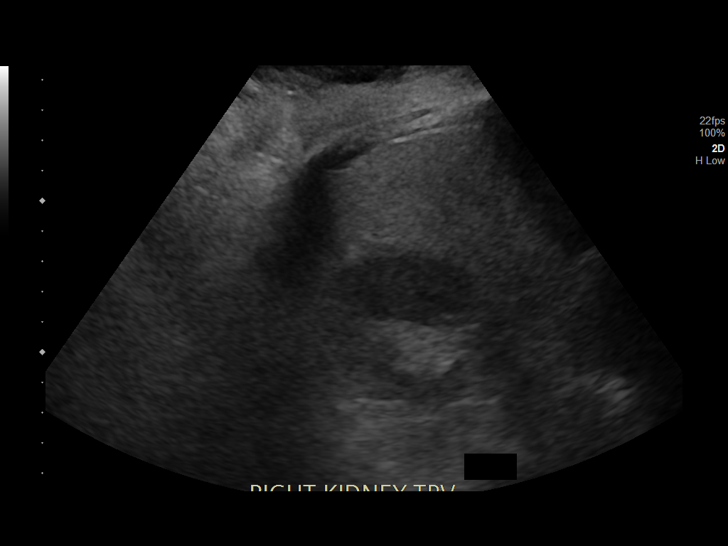
[im 7/21]
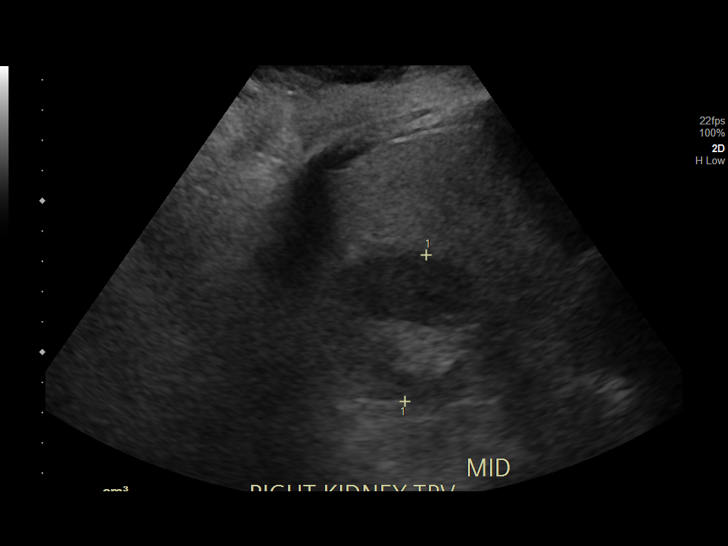
[im 9/21]
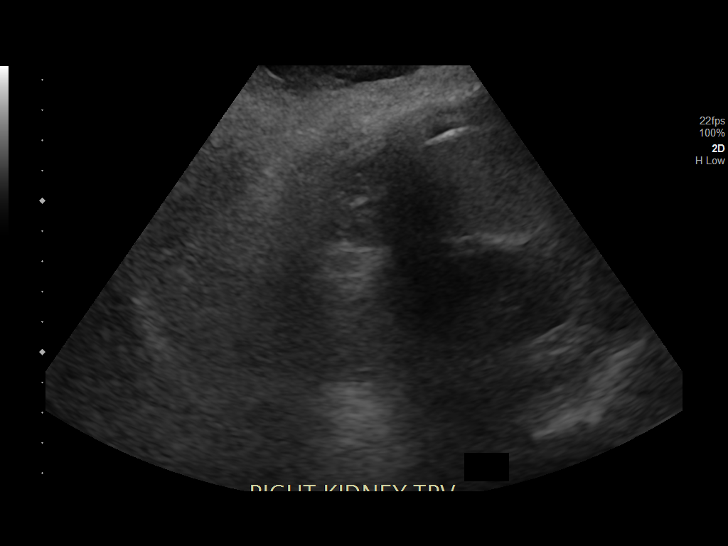
[im 10/21]
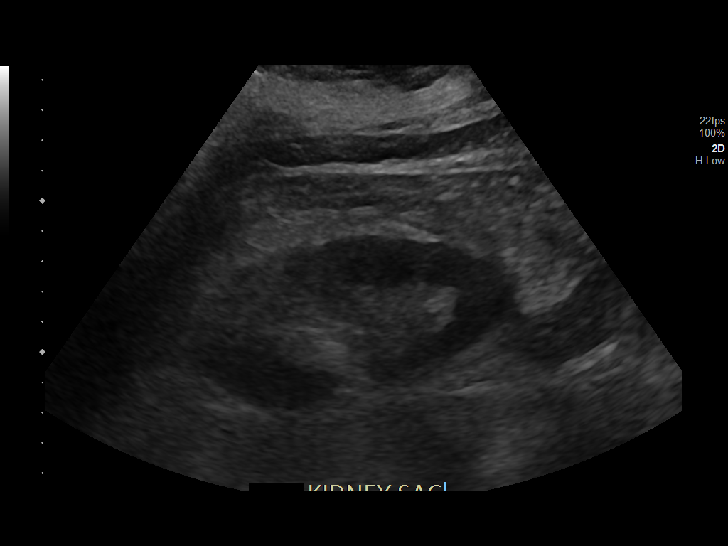
[im 12/21]
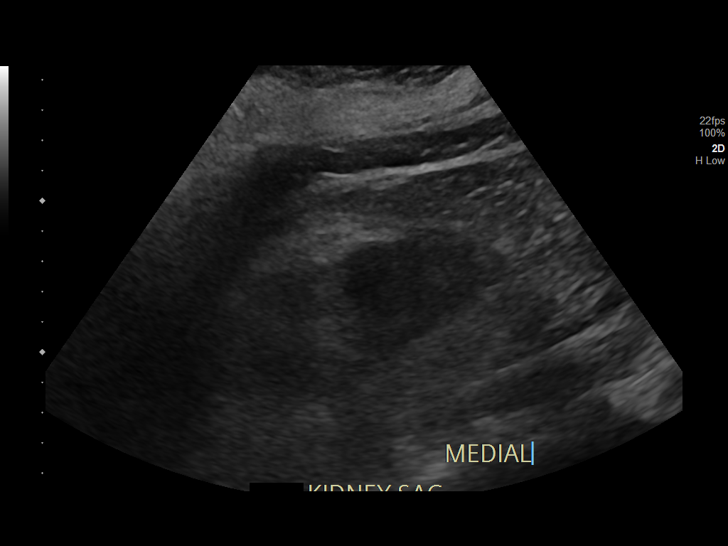
[im 13/21]
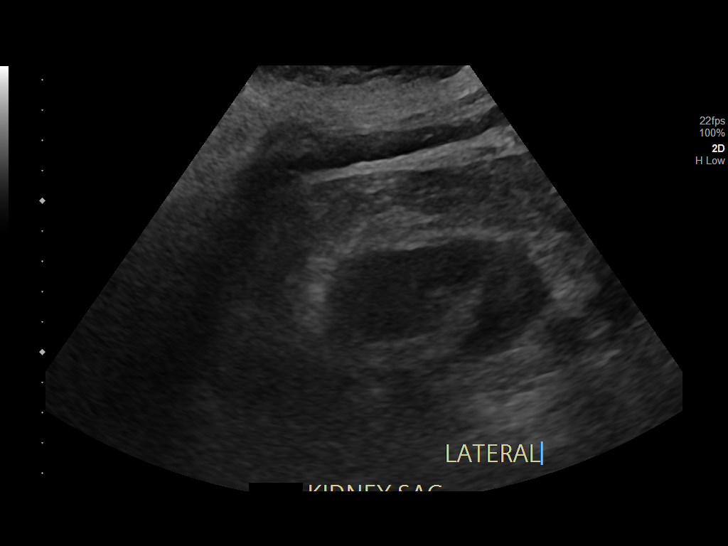
[im 15/21]
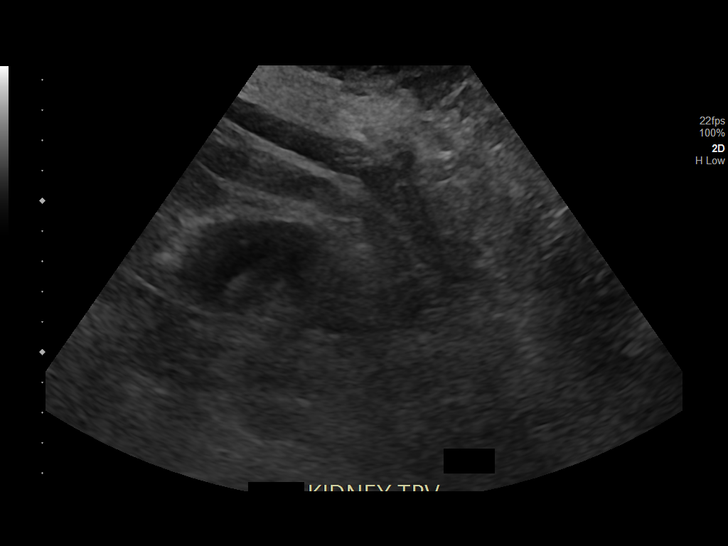
[im 16/21]
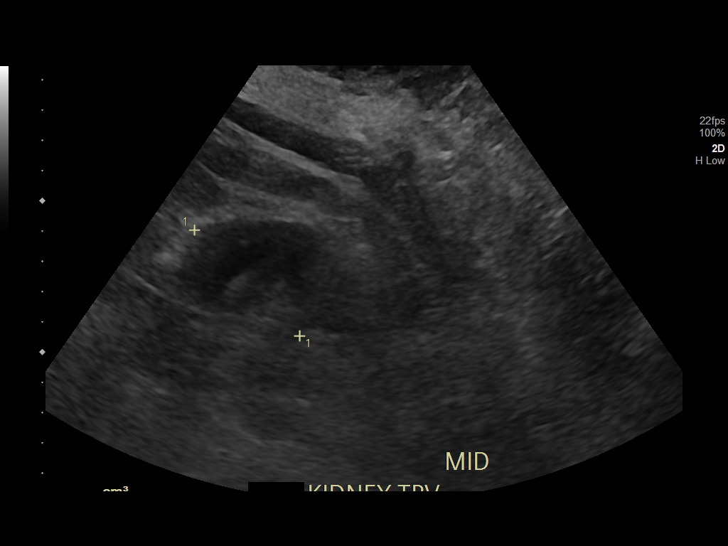
[im 18/21]
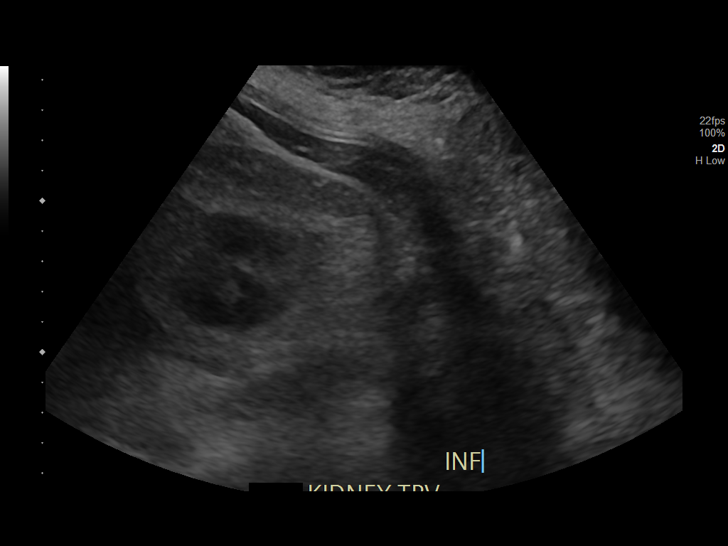
[im 19/21]
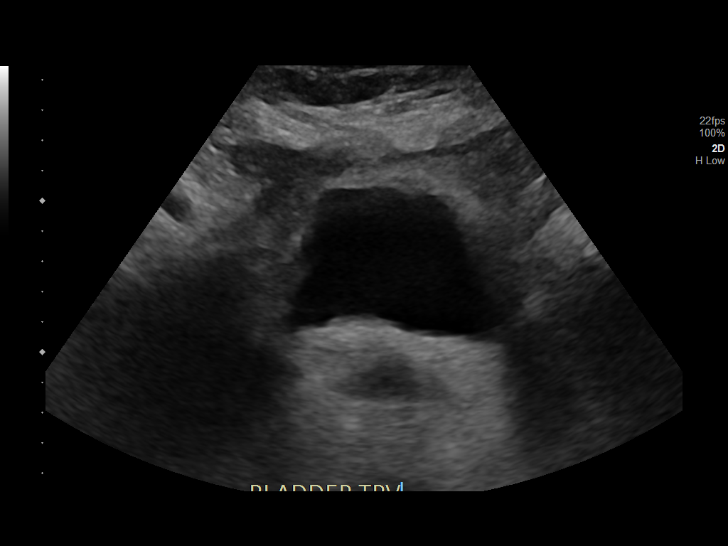
[im 21/21]
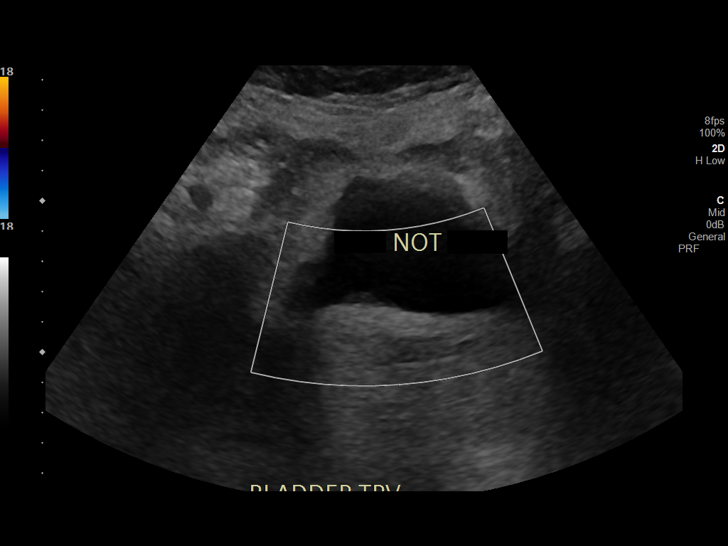

[14 of 21 positions shown; findings below may reference images not displayed]

FINDINGS: Right Kidney:

Renal measurements: 10.4 x 4.3 x 4.9 cm = volume: 114 mL.
Echogenicity within normal limits. No mass or hydronephrosis
visualized.

Left Kidney:

Renal measurements: 11.3 x 4.8 x 4.9 cm = volume: 140 mL.
Echogenicity within normal limits. No mass or hydronephrosis
visualized.

Bladder:

Appears normal for degree of bladder distention.

Other:

Hepatic steatosis.
IMPRESSION: No evidence of obstructive uropathy.

## 2023-11-11 NOTE — Progress Notes (Signed)
 11/15/2023      Lori Graham   03/17/1951   Vitals:   11/15/23 1116  BP: 134/84  BP Location: Right arm  Patient Position: Sitting  Pulse: 77  SpO2: 99%  Weight: 86.6 kg (191 lb)  Height: 1.575 m (5' 2)        Chief Complaint  Patient presents with  . Follow-up  . Hypertension        Current Outpatient Medications  Medication Sig Dispense Refill  . amLODIPine  (NORVASC ) 10 mg tablet TAKE 1 TABLET BY MOUTH DAILY 90 tablet 1  . Armour Thyroid  30 mg tablet TAKE 1 TABLET BY MOUTH DAILY 30 tablet 5  . ascorbic acid (VITAMIN C ORAL) Take by mouth daily.    SABRA aspirin 81 mg EC tablet Take 81 mg by mouth. Not taking daily    . Bacillus coagulans (Digestive Advantage Probio-Pre) 400 million cell chew Take  by mouth.    . cartilage/collagen/bor/hyalur (MOVE FREE ULTRA TRIPLE ACTION ORAL) Take by mouth.    . carvediloL  (COREG ) 6.25 mg tablet TAKE 1 TABLET BY MOUTH 2 TIMES A DAY 180 tablet 1  . coenzyme Q-10 200 mg capsule Take by mouth daily.    . diclofenac sodium (VOLTAREN) 1 % gel Apply 1 g topically as needed.    . hydroCHLOROthiazide (HYDRODIURIL) 12.5 mg tablet Take 1 tablet (12.5 mg total) by mouth daily. 90 tablet 1  . lysine 500 mg tab Take  by mouth 2 (two) times a day.    SABRA MAGNESIUM ORAL Take 420 mg by mouth daily.    SABRA omega 3-dha-epa-fish oil (OMEGA 3) 1,000 mg DR capsule Take 1 capsule by mouth daily.    SABRA s-adenosylmethionine (SAM-e) 400 mg tab Take 400 mg by mouth daily.    SABRA UNABLE TO FIND Take 1 each by mouth daily. Med Name: Qunol ES Turmeric 1000mg     . UNABLE TO FIND daily. Med Name: Super K2    . UNABLE TO FIND daily. Med Name: Zone Biotics    . UNABLE TO FIND daily. Med Name: [facility]    . UNABLE TO FIND daily. Med Name: MCT Oil    . vitamin B complex (B COMPLEX 1 ORAL) Take 50 mg by mouth.    SABRA ZINC ORAL Take 50 mg by mouth.     No current facility-administered medications for this visit.     Allergies  Allergen Reactions  .  Benzoin Bronchospasm        1. Essential hypertension      2. Subclinical hypothyroidism  TSH   T4, Free    3. Vision changes  Ambulatory referral to Ophthalmology    4. Elevated serum GGT level      5. Chronic left shoulder pain  XR Shoulder Minimum 2 Views Left    6. Leg swelling               The following portions of the patient's history were reviewed and updated as appropriate: allergies, current medications, PFH, PMH, past social history, past surgical history and problem list.          HPI:   1 month f/u -did she get her labs I ordered on 29 April? --no, she thought it was supposed to be done here  HYPERTENSION [name] presents for medical management of Hypertension. Patient's current hypertension medication regimen is: hydrochlorothiazide 12.5 daily, Amlodipine  10 MG daily and Carvedilol  6.25 MG BID Patient is currently taking prescribed medications  for HTN. Patient is not adhering to a low sodium diet. Patient is not regularly keeping a check on BP at home. Patient reports BP is well controlled at home. Patient declines headaches or blurred vision.  BP Readings from Last 3 Encounters:  11/15/23 134/84  10/18/23 152/86  10/03/23 140/72      Leg swelling -a little bit better with the hctz  Left shoulder pain -is bothering her every day, but not all day long  -RHD  Elevated GGT -RUQ u/s- fatty liver  Vision changes -when optho appt? --didn't have a chance to schedule  -currently just on readers- at 3        ROS: A complete ROS was performed with pertinent positives/negatives noted in the HPI. The remainder of the ROS are negative.?         PHYSICAL EXAM:    GENERAL: Well appearing, well developed, well nourished, in NAD.   HEENT:    HEAD: Normocephalic.     EYES: Conjunctiva clear without exudates.    NOSE: Nares patent  RESPIRATORY: Respirations even and non-labored. Breath sounds clear to auscultation  bilaterally.    CARDIAC: Heart sounds clear, S1 S2, regular rate and rhythm without murmur or gallops.    EXTREMITIES: bilateral lower extremity edema  NEUROLOGIC: Alert and oriented x3.. Steady, even gait.          Lab on 10/10/2023  Component Date Value Ref Range Status  . Cholesterol, Total, Lipid Panel 10/10/2023 241 (H)  <200 mg/dL Final   NCEP III Guidelines   Total Cholesterol             Risk Classification                           <200 mg/dL                      Desirable    200-239 mg/dL                   Borderline High    >240 mg/dL                      High  . Triglycerides, Lipid Panel 10/10/2023 148  <150 mg/dL Final   NCEP-ATP III Guidelines    Triglyceride Value           Risk Classification                           <150 mg/dL                   Normal    150-199 mg/dL                Borderline High    200-499 mg/dL                High    >=500 mg/dL                  Very High   . HDL Cholesterol - Lipid Panel 10/10/2023 66  >=60 mg/dL Final   NCEP III Guidelines    HDLc                     Risk Classification                           >=  60 mg/dL                  Optimal    >=40 mg/dL                  Desirable    <40 mg/dL                   Low      . LDL Cholesterol, Calculated 10/10/2023 148 (H)  <100 mg/dL Final   LDL Cholesterol is calculated using the Martin/Hopkins equation. Source:  JAMA 2013; 310:  2061-68. NCEP-ATP III Guidelines    LDLc                     Risk Classification                           <100 mg/dL                   Optimal    100-129 mg/dL                Desirable    130-159 mg/dL                Borderline High    160-189 mg/dL                High    >190 mg/dL                   Very High   . Non-HDL Cholesterol 10/10/2023 175  mg/dL Final  . Sodium 95/78/7974 142  136 - 145 mmol/L Final  . Potassium 10/10/2023 4.0  3.5 - 5.1 mmol/L Final   NO VISIBLE HEMOLYSIS  . Chloride 10/10/2023 105  98 - 107 mmol/L Final  .  CO2 10/10/2023 29  21 - 31 mmol/L Final  . Anion Gap 10/10/2023 8  6 - 14 mmol/L Final  . Glucose, Random 10/10/2023 93  70 - 99 mg/dL Final  . Blood Urea Nitrogen (BUN) 10/10/2023 23  7 - 25 mg/dL Final  . Creatinine 95/78/7974 1.25 (H)  0.60 - 1.20 mg/dL Final  . eGFR 95/78/7974 46 (L)  >59 mL/min/1.72m2 Final   GFR estimated by CKD-EPI equations(NKF 2021).   Recommend confirmation of Cr-based eGFR by using Cys-based eGFR and other filtration markers (if applicable) in complex cases and clinical decision-making, as needed.  . Albumin 10/10/2023 4.7  3.5 - 5.7 g/dL Final  . Total Protein 10/10/2023 6.9  6.4 - 8.9 g/dL Final  . Bilirubin, Total 10/10/2023 0.9  0.3 - 1.0 mg/dL Final  . Alkaline Phosphatase (ALP) 10/10/2023 111 (H)  34 - 104 U/L Final  . Aspartate Aminotransferase (AST) 10/10/2023 19  13 - 39 U/L Final  . Alanine Aminotransferase (ALT) 10/10/2023 18  7 - 52 U/L Final  . Calcium 10/10/2023 9.9  8.6 - 10.3 mg/dL Final  . BUN/Creatinine Ratio 10/10/2023 18.4  10.0 - 20.0 Final  . WBC 10/10/2023 6.00  4.40 - 11.00 10*3/uL Final  . RBC 10/10/2023 4.95  4.10 - 5.10 10*6/uL Final  . Hemoglobin 10/10/2023 15.1  12.3 - 15.3 g/dL Final  . Hematocrit 95/78/7974 44.1  35.9 - 44.6 % Final  . Mean Corpuscular Volume (MCV) 10/10/2023 89.1  80.0 - 96.0 fL Final  . Mean Corpuscular Hemoglobin (MCH) 10/10/2023 30.4  27.5 - 33.2 pg Final  . Mean Corpuscular Hemoglobin Conc (* 10/10/2023 34.2  33.0 - 37.0  g/dL Final  . Red Cell Distribution Width (RDW) 10/10/2023 14.0  12.3 - 17.0 % Final  . Platelet Count (PLT) 10/10/2023 181  150 - 450 10*3/uL Final  . Mean Platelet Volume (MPV) 10/10/2023 7.4  6.8 - 10.2 fL Final  . Neutrophils % 10/10/2023 58  % Final  . Lymphocytes % 10/10/2023 35  % Final  . Monocytes % 10/10/2023 7  % Final  . Eosinophils % 10/10/2023 1  % Final  . Basophils % 10/10/2023 1  % Final  . nRBC % 10/10/2023 0  % Final  . Neutrophils Absolute 10/10/2023 3.50  1.80 -  7.80 10*3/uL Final  . Lymphocytes # 10/10/2023 2.10  1.00 - 4.80 10*3/uL Final  . Monocytes # 10/10/2023 0.40  0.00 - 0.80 10*3/uL Final  . Eosinophils # 10/10/2023 0.00  0.00 - 0.50 10*3/uL Final  . Basophils # 10/10/2023 0.00  0.00 - 0.20 10*3/uL Final  . nRBC Absolute 10/10/2023 0.00  <=0.00 10*3/uL Final           PLAN:  1. Essential hypertension (Primary) - at goal - continue current regimen  2. Subclinical hypothyroidism - WNL in April - will check again - TSH; Future - T4, Free; Future  3. Vision changes - referral to ophtho placed today - Ambulatory referral to Ophthalmology; Future  4. Elevated serum GGT level - hasn't had a chance to get her repeat labs done - pending results, may refer to GI for further evaluation  5. Chronic left shoulder pain - x-ray ordered today - XR Shoulder Minimum 2 Views Left; Future  6. Leg swelling - somewhat improved since adding the hydrochlorothiazide - repeat labs ordered as above - if BNP high, will get repeat echo --last was in Feb 2024- LVEF >70%         No orders of the defined types were placed in this encounter.       Orders Placed This Encounter  Procedures  . XR Shoulder Minimum 2 Views Left  . TSH  . T4, Free  . Ambulatory referral to Ophthalmology        Return in about 3 months (around 02/15/2024) for f/u HTN, leg swelling .    This document serves as a record of services personally performed by Lauraine Estefana Lent, DO.  It was created on their behalf by Bernice Minerva, CMA, a trained medical scribe, and Certified Medical Assistant (CMA). During the course of documenting the history, physical exam and medical decision making, I was functioning as a Stage manager. The creation of this record is the provider's dictation and/or activities during the visit.  Electronically signed by Bernice Minerva, CMA 11/11/2023 4:19 PM           Electronically signed by Bernice Minerva, CMA 11/11/2023 4:17 PM        I agree the documentation is accurate and complete.  Electronically signed by: Lauraine Estefana Lent, DO 11/15/2023 11:58 AM

## 2024-01-16 ENCOUNTER — Encounter (HOSPITAL_BASED_OUTPATIENT_CLINIC_OR_DEPARTMENT_OTHER): Payer: Self-pay | Admitting: Emergency Medicine

## 2024-01-16 ENCOUNTER — Emergency Department (HOSPITAL_BASED_OUTPATIENT_CLINIC_OR_DEPARTMENT_OTHER): Admission: EM | Admit: 2024-01-16 | Discharge: 2024-01-16 | Disposition: A | Discharge status: Home / Self Care

## 2024-01-16 ENCOUNTER — Other Ambulatory Visit: Payer: Self-pay

## 2024-01-16 ENCOUNTER — Emergency Department (HOSPITAL_BASED_OUTPATIENT_CLINIC_OR_DEPARTMENT_OTHER)

## 2024-01-16 DIAGNOSIS — M7989 Other specified soft tissue disorders: Secondary | ICD-10-CM | POA: Insufficient documentation

## 2024-01-16 DIAGNOSIS — E039 Hypothyroidism, unspecified: Secondary | ICD-10-CM | POA: Diagnosis not present

## 2024-01-16 DIAGNOSIS — I129 Hypertensive chronic kidney disease with stage 1 through stage 4 chronic kidney disease, or unspecified chronic kidney disease: Secondary | ICD-10-CM | POA: Insufficient documentation

## 2024-01-16 DIAGNOSIS — N189 Chronic kidney disease, unspecified: Secondary | ICD-10-CM | POA: Insufficient documentation

## 2024-01-16 DIAGNOSIS — Z79899 Other long term (current) drug therapy: Secondary | ICD-10-CM | POA: Diagnosis not present

## 2024-01-16 MED ORDER — PREDNISONE 10 MG (21) PO TBPK: ORAL_TABLET | Freq: Every day | ORAL | 0 refills | Status: DC

## 2024-01-16 MED ORDER — OXYCODONE-ACETAMINOPHEN 5-325 MG PO TABS
1.0000 | ORAL_TABLET | ORAL | Status: DC | PRN
  Administered 2024-01-16: 1 via ORAL
  Filled 2024-01-16: qty 1

## 2024-01-16 NOTE — Discharge Instructions (Signed)
 Today you were seen for left leg pain and swelling.  Please elevate the affected limb and alternate taking Tylenol  and Motrin as needed for pain.  Please follow-up with your PCP if your symptoms persist for further evaluation workup.  Please return to the ED if you have sudden shortness of breath, worsening swelling, or streaking erythema up your leg.  Thank you for letting us  treat you today. After reviewing your imaging, I feel you are safe to go home. Please follow up with your PCP in the next several days and provide them with your records from this visit. Return to the Emergency Room if pain becomes severe or symptoms worsen.

## 2024-01-16 NOTE — ED Provider Notes (Signed)
 [place] [facility] AT [facility] Provider Note   CSN: [id number] Arrival date & time: 01/16/24  1502     Patient presents with: Leg Swelling   Lori Graham is a 73 y.o. female past medical history significant for hypertension, hypothyroid, and CKD presents today for left lower extremity edema and pain since Wednesday.  Patient was seen at an urgent care today and sent here for DVT rule out.   HPI     Prior to Admission medications   Medication Sig Start Date End Date Taking? Authorizing Provider  amLODipine  (NORVASC ) 10 MG tablet Take 1 tablet (10 mg total) by mouth daily. 12/14/21 12/14/22  Singh, Prashant K, MD  ARMOUR THYROID  30 MG tablet Take 60 mg by mouth daily before breakfast. 11/09/21   [provider]  carvedilol  (COREG ) 6.25 MG tablet Take 1 tablet (6.25 mg total) by mouth 2 (two) times daily with a meal. 12/14/21   Singh, Prashant K, MD  Coenzyme Q10 (CO Q10 PO) Take 1 tablet by mouth daily.    [provider]  diphenhydrAMINE (BENADRYL ALLERGY) 25 MG tablet Take 25 mg by mouth daily as needed for itching.    [provider]  Folic Acid (FOLATE PO) Take 1 tablet by mouth daily.    [provider]  Glucosamine HCl (GLUCOSAMINE PO) Take 1 tablet by mouth daily.    [provider]  methylPREDNISolone  (MEDROL  DOSEPAK) 4 MG TBPK tablet Take 6 tabs by mouth on day 1, then 5 tabs by mouth on day 2, then 4 tabs by mouth on day 3, then 3 tabs by mouth on day 4, then 2 tabs by mouth on day 5, then 1 tab by mouth on day 6. 12/14/21   Singh, Prashant K, MD  polyethylene glycol powder (GLYCOLAX /MIRALAX ) 17 GM/SCOOP powder Take 17 g by mouth daily. 12/14/21   Singh, Prashant K, MD  triamcinolone ointment (KENALOG) 0.1 % Apply 1 Application topically daily as needed (for rash). 10/06/21   [provider]    Allergies: Benzoin    Review of Systems  Cardiovascular:  Positive for leg swelling.    Updated Vital  Signs BP (!) 142/75 (BP Location: Right Arm)   Pulse 77   Temp 98.2 F (36.8 C) (Oral)   Resp 17   Ht 5' 2 (1.575 m)   Wt 83.9 kg   SpO2 100%   BMI 33.84 kg/m   Physical Exam  (all labs ordered are listed, but only abnormal results are displayed) Labs Reviewed - No data to display  EKG: None  Radiology: US  Venous Img Lower Unilateral Left Result Date: 01/16/2024 CLINICAL DATA:  Left lower extremity edema and calf pain since Wednesday. EXAM: Left LOWER EXTREMITY VENOUS DOPPLER ULTRASOUND TECHNIQUE: Gray-scale sonography with compression, as well as color and duplex ultrasound, were performed to evaluate the deep venous system(s) from the level of the common femoral vein through the popliteal and proximal calf veins. COMPARISON:  None Available. FINDINGS: VENOUS Normal compressibility of the common femoral, superficial femoral, and popliteal veins, as well as the visualized calf veins. Visualized portions of profunda femoral vein and great saphenous vein unremarkable. No filling defects to suggest DVT on grayscale or color Doppler imaging. Doppler waveforms show normal direction of venous flow, normal respiratory plasticity and response to augmentation. Limited views of the contralateral common femoral vein are unremarkable. OTHER Soft tissue fluid collection in the popliteal fossa measuring 3.8 x 0.5 x 1.2 cm. Limitations: none IMPRESSION: 1. No  evidence of acute deep venous thrombosis in the visualized lower extremity veins. 2. Small left popliteal cyst. Electronically Signed   By: Elsie Gravely M.D.   On: 01/16/2024 18:30     Procedures   Medications Ordered in the ED  oxyCODONE -acetaminophen  (PERCOCET/ROXICET) 5-325 MG per tablet 1 tablet (1 tablet Oral Given 01/16/24 1614)                                    Medical Decision Making Risk Prescription drug management.   This patient presents to the ED for concern of left lower extremity edema and pain differential diagnosis  includes venous insufficiency, DVT, peripheral artery disease   Additional history obtained   Additional history obtained from Electronic Medical Record External records from outside source obtained and reviewed including Care Everywhere urgent care notes   Imaging Studies ordered:  I ordered imaging studies including left lower extremity DVT ultrasound I independently visualized and interpreted imaging which showed negative for DVT, small left popliteal cyst I agree with the radiologist interpretation   Medicines ordered and prescription drug management:  I ordered medication including Percocet    I have reviewed the patients home medicines and have made adjustments as needed   Problem List / ED Course:  Considered for admission or further workup however patient's vital signs, physical exam, and imaging are reassuring.  Patient advised to elevate affected limb and alternate Tylenol  and Motrin as needed for pain.  Patient to follow-up with her primary care for further evaluation and workup if her symptoms persist.  Patient given return precautions.  I feel patient safe for discharge at this time.     Final diagnoses:  Leg swelling    ED Discharge Orders     None          Lori Graham 01/16/24 1857    [address] Megan L, DO 01/17/24 [phone]

## 2024-01-16 NOTE — ED Triage Notes (Addendum)
 Pt reports LLE edema and pain since Wed with claudification, went to UC and was sent here to r/o DVT  Denies CP, Shob or mental status changes

## 2024-04-17 NOTE — Progress Notes (Signed)
 04/19/2024      Lori Graham   11-18-1950   Vitals:   04/19/24 1512  BP: 136/80  BP Location: Right arm  Patient Position: Sitting  Pulse: 62  SpO2: 99%  Weight: 86.2 kg (190 lb)  Height: 1.575 m (5' 2)        Chief Complaint  Patient presents with   Medicare Annual Wellness Visit Subsequent        Allergies[1]           The following portions of the patient's history were reviewed and updated as appropriate: allergies, current medications, PFH, PMH, past social history, past surgical history and problem list.          HPI:   SUBSEQUENT MEDICARE WELLNESS VISIT  Last c-scope: never had one, patient declines today  Last mammogram: 2013, patient declines today   Last dexa: Nov 2024 (normal)     73 y/o F here for her MWV. She recently got labs, which she would like to review. Since our last visit, she was able to get glasses- but they aren't helping the best.       ROS: A complete ROS was performed with pertinent positives/negatives noted in the HPI. The remainder of the ROS are negative.?         PHYSICAL EXAM:    GENERAL: Well appearing, well developed, well nourished, in NAD.   HEENT:    HEAD: Normocephalic.     EYES: Conjunctiva clear without exudates.    EARS: EACs clear. TMs intact, translucent w/o bulging, appropriate landmarks visualized.    NOSE: Nares patent, mucosa pink and non-inflamed w/o drainage.   THROAT: Uvula midline. Oropharynx clear. Tonsils non-inflamed w/o exudate. Mucous membranes pink and moist.   RESPIRATORY: Respirations even and non-labored. Breath sounds clear to auscultation bilaterally.    CARDIAC: Heart sounds clear, S1 S2, regular rate and rhythm without murmur or gallops.    NEUROLOGIC: Alert and oriented x3.  Steady, even gait.          Lab on 04/16/2024  Component Date Value Ref Range Status   Sodium 04/16/2024 142  136 - 145 mmol/L Final   Potassium 04/16/2024 3.9  3.5 - 5.1 mmol/L  Final   NO VISIBLE HEMOLYSIS   Chloride 04/16/2024 101  98 - 107 mmol/L Final   CO2 04/16/2024 31  21 - 31 mmol/L Final   High lactate dehydrogenase (LDH) concentrations in patient samples may cause falsely increased bicarbonate results. If markedly elevated LDH levels are suspected, please assess results in conjunction with patient's LDH values.   Anion Gap 04/16/2024 10  6 - 14 mmol/L Final   Glucose, Random 04/16/2024 75  70 - 99 mg/dL Final   Blood Urea Nitrogen (BUN) 04/16/2024 20  7 - 25 mg/dL Final   Creatinine 89/72/7974 1.19  0.60 - 1.20 mg/dL Final   eGFR 89/72/7974 48 (L)  >59 mL/min/1.55m2 Final   GFR estimated by CKD-EPI equations(NKF 2021).   Recommend confirmation of Cr-based eGFR by using Cys-based eGFR and other filtration markers (if applicable) in complex cases and clinical decision-making, as needed.   Albumin 04/16/2024 4.7  3.5 - 5.7 g/dL Final   Total Protein 89/72/7974 7.1  6.4 - 8.9 g/dL Final   Bilirubin, Total 04/16/2024 1.1 (H)  0.3 - 1.0 mg/dL Final   Alkaline Phosphatase (ALP) 04/16/2024 82  34 - 104 U/L Final   Aspartate Aminotransferase (AST) 04/16/2024 22  13 - 39 U/L Final   Alanine Aminotransferase (ALT) 04/16/2024  16  7 - 52 U/L Final   Calcium 04/16/2024 10.0  8.6 - 10.3 mg/dL Final   BUN/Creatinine Ratio 04/16/2024    Final   Creatinine is normal, ratio is not clinically indicated.    Cholesterol, Total, Lipid Panel 04/16/2024 261 (H)  <200 mg/dL Final                Optimal: <200 mg/dL Borderline High Risk:  200-239 mg/dL            High Risk: >760 mg/dL   Triglycerides, Lipid Panel 04/16/2024 138  <150 mg/dL Final                Optimal: <150 mg/dL Borderline High Risk:  150-199 mg/dL            High Risk: >800 mg/dL   HDL Cholesterol - Lipid Panel 04/16/2024 68  >50 mg/dL Final                Optimal: >60 mg/dL Borderline High Risk:  40-60 mg/dL            High Risk: <59 mg/dL   LDL Cholesterol, Calculated 04/16/2024 165  (H)  <100 mg/dL Final                Optimal: <100 mg/dL         Near Optimal:  100-129 Borderline High Risk:  130-159 mg/dL            High Risk:  160-189 mg/dL       Very High Risk: >810 mg/dL   Non-HDL Cholesterol 89/72/7974 193 (H)  <130 mg/dL Final                Optimal: <130 mg/dL Borderline High Risk:  130-160 mg/dL            High Risk: >839 mg/dL   TSH 89/72/7974 5.430  0.450 - 5.330 uIU/mL Final   T4, Free 04/16/2024 0.8  0.6 - 1.1 ng/dL Final   Vitamin D  25-Hydroxy 04/16/2024 107.7 (H)  30.0 - 100.0 ng/mL Final   Deficient: <20 ng/mL  Insufficient: 20-30 ng/mL  Sufficient: 30-100 ng/mL  Upper Safety Limit/Toxicity: >100 ng/mL    WBC 04/16/2024 5.40  4.40 - 11.00 10*3/uL Final   RBC 04/16/2024 5.11 (H)  4.10 - 5.10 10*6/uL Final   Hemoglobin 04/16/2024 15.4 (H)  12.3 - 15.3 g/dL Final   Hematocrit 89/72/7974 45.1 (H)  35.9 - 44.6 % Final   Mean Corpuscular Volume (MCV) 04/16/2024 88.4  80.0 - 96.0 fL Final   Mean Corpuscular Hemoglobin (MCH) 04/16/2024 30.2  27.5 - 33.2 pg Final   Mean Corpuscular Hemoglobin Conc (* 04/16/2024 34.2  33.0 - 37.0 g/dL Final   Red Cell Distribution Width (RDW) 04/16/2024 14.3  12.3 - 17.0 % Final   Platelet Count (PLT) 04/16/2024 179  150 - 450 10*3/uL Final   Mean Platelet Volume (MPV) 04/16/2024 7.2  6.8 - 10.2 fL Final   Neutrophils % 04/16/2024 58  % Final   Lymphocytes % 04/16/2024 30  % Final   Monocytes % 04/16/2024 11  % Final   Eosinophils % 04/16/2024 1  % Final   Basophils % 04/16/2024 1  % Final   nRBC % 04/16/2024 1  % Final   Neutrophils Absolute 04/16/2024 3.10  1.80 - 7.80 10*3/uL Final   Lymphocytes # 04/16/2024 1.60  1.00 - 4.80 10*3/uL Final   Monocytes # 04/16/2024 0.60  0.00 - 0.80 10*3/uL Final   Eosinophils #  04/16/2024 0.00  0.00 - 0.50 10*3/uL Final   Basophils # 04/16/2024 0.00  0.00 - 0.20 10*3/uL Final   nRBC Absolute 04/16/2024 0.00  <=0.00 10*3/uL Final           PLAN:  1.  Encounter for Medicare annual wellness exam (Primary) -see separate note -prevmed per HPI -labs notable for elevated lipids -f/u in 6 months or sooner if needed  2. Essential hypertension - at goal - continue current regimen  - CBC with Differential; Future - Comprehensive Metabolic Panel; Future  3. Mixed hyperlipidemia - not at goal - not interested in statins - will consider other cholesterol lowering options (eg. Zetia, fenofibrate) - Lipid Panel; Future  4. Stage 3a chronic kidney disease (HCC) - stable - continue to monitor - CBC with Differential; Future - Comprehensive Metabolic Panel; Future - Vitamin D , 25-Hydroxy; Future  5. Acquired hypothyroidism - at goal - continue current regimen - TSH; Future - T4, Free; Future  6. High serum vitamin D  - improving - continue to monitor  - Vitamin D , 25-Hydroxy; Future            Return in 1 year (on 04/20/2025) for Medicare Annual Wellness Visit; 6 month f/u + labs .     This document serves as a record of services personally performed by Lauraine Estefana Lent, DO.  It was created on their behalf by Bernice Minerva, CMA, a trained medical scribe, and Certified Medical Assistant (CMA). During the course of documenting the history, physical exam and medical decision making, I was functioning as a stage manager. The creation of this record is the providers dictation and/or activities during the visit.  Electronically signed by Bernice Minerva, CMA 04/17/2024 3:49 PM          Electronically signed by Bernice Minerva, CMA 04/17/2024 3:48 PM        I agree the documentation is accurate and complete.  Electronically signed by: Lauraine Estefana Lent, DO 04/19/2024 4:32 PM                 [1] Allergies Allergen Reactions   Benzoin Bronchospasm

## 2024-06-01 ENCOUNTER — Other Ambulatory Visit: Payer: Self-pay

## 2024-06-01 ENCOUNTER — Emergency Department (HOSPITAL_BASED_OUTPATIENT_CLINIC_OR_DEPARTMENT_OTHER)
Admission: EM | Admit: 2024-06-01 | Discharge: 2024-06-01 | Disposition: A | Discharge status: Home / Self Care | Attending: Emergency Medicine | Admitting: Emergency Medicine

## 2024-06-01 ENCOUNTER — Emergency Department (HOSPITAL_BASED_OUTPATIENT_CLINIC_OR_DEPARTMENT_OTHER)

## 2024-06-01 ENCOUNTER — Encounter (HOSPITAL_BASED_OUTPATIENT_CLINIC_OR_DEPARTMENT_OTHER): Payer: Self-pay | Admitting: Emergency Medicine

## 2024-06-01 DIAGNOSIS — M5442 Lumbago with sciatica, left side: Secondary | ICD-10-CM | POA: Diagnosis not present

## 2024-06-01 DIAGNOSIS — G8929 Other chronic pain: Secondary | ICD-10-CM

## 2024-06-01 DIAGNOSIS — Z79899 Other long term (current) drug therapy: Secondary | ICD-10-CM | POA: Insufficient documentation

## 2024-06-01 DIAGNOSIS — M25552 Pain in left hip: Secondary | ICD-10-CM | POA: Diagnosis present

## 2024-06-01 DIAGNOSIS — M5441 Lumbago with sciatica, right side: Secondary | ICD-10-CM | POA: Diagnosis not present

## 2024-06-01 MED ORDER — HYDROCODONE-ACETAMINOPHEN 5-325 MG PO TABS
1.0000 | ORAL_TABLET | Freq: Once | ORAL | Status: AC
  Administered 2024-06-01: 1 via ORAL
  Filled 2024-06-01: qty 1

## 2024-06-01 MED ORDER — PREDNISONE 10 MG (21) PO TBPK: ORAL_TABLET | Freq: Every day | ORAL | 0 refills | Status: AC

## 2024-06-01 MED ORDER — HYDROCODONE-ACETAMINOPHEN 5-325 MG PO TABS
2.0000 | ORAL_TABLET | Freq: Once | ORAL | Status: AC
  Administered 2024-06-01: 2 via ORAL
  Filled 2024-06-01: qty 2

## 2024-06-01 NOTE — Discharge Instructions (Signed)
 I recommend following up with primary care in the next several weeks, as well as with neurosurgery for your chronic back pain for further treatment and imaging.

## 2024-06-01 NOTE — ED Triage Notes (Signed)
 Pt with left hip pain and toward her sacral area.  Pt has hx of pain here but states this is much worse.  Reports movement of that left leg causes her to have 10/10 pain.

## 2024-06-02 NOTE — ED Provider Notes (Signed)
 [facility] Provider Note   CSN: [id number] Arrival date & time: 06/01/24  1810     Patient presents with: Back Pain   Lori Graham is a 73 y.o. female who presented with primary complaint of left hip pain which she described herself as towards the sacrum, history of chronic lumbar pain, states that she has increased pain with ambulation and weightbearing, otherwise does not have any saddle anesthesia nor does she have any new onset of bowel or bladder incontinence.    Back Pain      Prior to Admission medications  Medication Sig Start Date End Date Taking? Authorizing Provider  predniSONE  (STERAPRED UNI-PAK 21 TAB) 10 MG (21) TBPK tablet Take by mouth daily. Take 6 tabs by mouth daily  for 2 days, then 5 tabs for 2 days, then 4 tabs for 2 days, then 3 tabs for 2 days, 2 tabs for 2 days, then 1 tab by mouth daily for 2 days 06/01/24  Yes Myriam Dorn BROCKS, PA  amLODipine  (NORVASC ) 10 MG tablet Take 1 tablet (10 mg total) by mouth daily. 12/14/21 12/14/22  Singh, Prashant K, MD  ARMOUR THYROID  30 MG tablet Take 60 mg by mouth daily before breakfast. 11/09/21   [provider]  carvedilol  (COREG ) 6.25 MG tablet Take 1 tablet (6.25 mg total) by mouth 2 (two) times daily with a meal. 12/14/21   Singh, Prashant K, MD  Coenzyme Q10 (CO Q10 PO) Take 1 tablet by mouth daily.    [provider]  diphenhydrAMINE (BENADRYL ALLERGY) 25 MG tablet Take 25 mg by mouth daily as needed for itching.    [provider]  Folic Acid (FOLATE PO) Take 1 tablet by mouth daily.    [provider]  Glucosamine HCl (GLUCOSAMINE PO) Take 1 tablet by mouth daily.    [provider]  polyethylene glycol powder (GLYCOLAX /MIRALAX ) 17 GM/SCOOP powder Take 17 g by mouth daily. 12/14/21   Singh, Prashant K, MD  triamcinolone ointment (KENALOG) 0.1 % Apply 1 Application topically daily as needed (for rash). 10/06/21   [provider]    Allergies: Benzoin    Review of Systems  Musculoskeletal:  Positive for back pain.  All other systems reviewed and are negative.   Updated Vital Signs BP (!) 155/77   Pulse 77   Temp 98.8 F (37.1 C) (Oral)   Resp (!) 21   SpO2 100%   Physical Exam Vitals and nursing note reviewed.  Constitutional:      General: She is not in acute distress.    Appearance: Normal appearance.  HENT:     Head: Normocephalic and atraumatic.     Mouth/Throat:     Mouth: Mucous membranes are moist.     Pharynx: Oropharynx is clear.  Eyes:     Extraocular Movements: Extraocular movements intact.     Conjunctiva/sclera: Conjunctivae normal.     Pupils: Pupils are equal, round, and reactive to light.  Cardiovascular:     Rate and Rhythm: Normal rate and regular rhythm.     Pulses: Normal pulses.     Heart sounds: Normal heart sounds. No murmur heard.    No friction rub. No gallop.  Pulmonary:     Effort: Pulmonary effort is normal.     Breath sounds: Normal breath sounds.  Abdominal:     General: Abdomen is flat. Bowel sounds are normal.     Palpations: Abdomen is soft.  Musculoskeletal:  Cervical back: Normal, normal range of motion and neck supple.     Thoracic back: Normal.     Lumbar back: Tenderness present. Positive right straight leg raise test and positive left straight leg raise test.     Right hip: Tenderness present.     Left hip: Tenderness present.     Right lower leg: No edema.     Left lower leg: No edema.     Comments: Left paraspinal tenderness appreciated and positive straight leg test bilaterally.  There is tenderness with palpation of bilateral greater trochanter.  Skin:    General: Skin is warm and dry.     Capillary Refill: Capillary refill takes less than 2 seconds.  Neurological:     General: No focal deficit present.     Mental Status: She is alert and oriented to person, place, and time. Mental status is at baseline.     GCS: GCS eye  subscore is 4. GCS verbal subscore is 5. GCS motor subscore is 6.     Cranial Nerves: No cranial nerve deficit.     Sensory: No sensory deficit.     Motor: No weakness.     Comments: No focal deficits appreciated, sensation intact in the bilateral lower extremities without any noted saddle anesthesia.  Psychiatric:        Mood and Affect: Mood normal.     (all labs ordered are listed, but only abnormal results are displayed) Labs Reviewed - No data to display  EKG: None  Radiology: DG Hip Unilat W or Wo Pelvis 2-3 Views Left Result Date: 06/01/2024 EXAM: 2 OR MORE VIEW(S) XRAY OF THE UNILATERAL HIP 06/01/2024 06:54:00 PM COMPARISON: None available. CLINICAL HISTORY: severe pain FINDINGS: BONES AND JOINTS: No acute fracture. No malalignment. Moderate degenerative changes of the visualized hip. LUMBAR SPINE: Degenerative changes of visualized lower lumbar spine. SOFT TISSUES: The soft tissues are unremarkable. IMPRESSION: 1. 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 Electronically signed by: Oneil Devonshire MD 06/01/2024 07:27 PM EST RP Workstation: [id number]     Procedures   Medications Ordered in the ED  HYDROcodone -acetaminophen  (NORCO/VICODIN) 5-325 MG per tablet 1 tablet (1 tablet Oral Given 06/01/24 2148)  HYDROcodone -acetaminophen  (NORCO/VICODIN) 5-325 MG per tablet 2 tablet (2 tablets Oral Given 06/01/24 2322)                                    Medical Decision Making Amount and/or Complexity of Data Reviewed Radiology: ordered.  Risk Prescription drug management.   Medical Decision Making:   Lori Graham is a 73 y.o. female who presented to the ED today with left hip pain detailed above.    Additional history discussed with patient's family/caregivers.  External chart has been reviewed including previous imaging. Complete initial physical exam performed, notably the patient  was alert and oriented no apparent  distress.  She has positive straight leg test bilaterally, tenderness at the bilateral greater trochanter, no noted sensory deficits. Reviewed and confirmed nursing documentation for past medical history, family history, social history.    Initial Assessment:   With the patient's presentation of hip and low back pain, differential diagnosis includes osteoarthritis, rheumatoid arthritis, lumbar radiculopathy.   Initial Plan:  Patient has decreased renal function and therefore cannot take NSAIDs, initial management of pain will be with oxycodone . Obtain plain film imaging of the left hip and pelvis to assess  for acute injury and/or chronic degenerative changes. Objective evaluation as below reviewed   Initial Study Results:    Radiology:  All images reviewed independently. Agree with radiology report at this time.   DG Hip Unilat W or Wo Pelvis 2-3 Views Left Result Date: 06/01/2024 EXAM: 2 OR MORE VIEW(S) XRAY OF THE UNILATERAL HIP 06/01/2024 06:54:00 PM COMPARISON: None available. CLINICAL HISTORY: severe pain FINDINGS: BONES AND JOINTS: No acute fracture. No malalignment. Moderate degenerative changes of the visualized hip. LUMBAR SPINE: Degenerative changes of visualized lower lumbar spine. SOFT TISSUES: The soft tissues are unremarkable. IMPRESSION: 1. 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 Electronically signed by: Oneil Devonshire MD 06/01/2024 07:27 PM EST RP Workstation: [id number]    Reassessment and Plan:   X-ray imaging shows some degenerative changes in the bilateral hips however does not demonstrate any acute injury.  Discussed with patient that this did not show definitive assessment of the lumbar spine and provided her with the option and shared decision making regarding CT imaging of the lumbar spine.  At this time she defers CT imaging and care plan is now all to continue management of the patient's lumbar and hip pain with  prednisone  as she cannot tolerate NSAIDs due to stage III CKD.  Further will provide ambulatory referral to neurosurgery for further assessment of her lumbar spine and likely advanced imaging.  Discussed this thoroughly with the patient along with return precautions which they verbalized understanding and agreement.  Therefore at this time we will manage pain with Norco and provide with short course for management of pain at home.  Also manage acute on chronic inflammation of the lumbar spine with prednisone  taper and provided ambulatory referral to neurosurgery for definitive management of her pain.       Final diagnoses:  Chronic bilateral low back pain with bilateral sciatica    ED Discharge Orders          Ordered    predniSONE  (STERAPRED UNI-PAK 21 TAB) 10 MG (21) TBPK tablet  Daily        06/01/24 2319    Ambulatory referral to Neurosurgery       Comments: Please Select To Department: [facility] for Nerve or Spine  Please select To Department: [facility] [place] for Cranial or Neurovascular   06/01/24 2321               Myriam Dorn BROCKS, [place] 06/02/24 0122
